# Patient Record
Sex: Male | Born: 1955 | Race: White | Hispanic: No | Marital: Married | Smoking: Former smoker
Health system: Southern US, Community
[De-identification: ages and names within clinical notes are randomized; demographics above are authoritative.]

## PROBLEM LIST (undated history)

## (undated) DIAGNOSIS — E669 Obesity, unspecified: Secondary | ICD-10-CM

## (undated) DIAGNOSIS — I2510 Atherosclerotic heart disease of native coronary artery without angina pectoris: Secondary | ICD-10-CM

## (undated) DIAGNOSIS — E1169 Type 2 diabetes mellitus with other specified complication: Secondary | ICD-10-CM

## (undated) DIAGNOSIS — I255 Ischemic cardiomyopathy: Secondary | ICD-10-CM

## (undated) DIAGNOSIS — I420 Dilated cardiomyopathy: Secondary | ICD-10-CM

## (undated) DIAGNOSIS — I82409 Acute embolism and thrombosis of unspecified deep veins of unspecified lower extremity: Secondary | ICD-10-CM

## (undated) HISTORY — DX: Ischemic cardiomyopathy: I25.5

---

## 1998-06-06 ENCOUNTER — Emergency Department (HOSPITAL_COMMUNITY): Admission: EM | Admit: 1998-06-06 | Discharge: 1998-06-06 | Payer: Self-pay | Admitting: Emergency Medicine

## 2013-11-18 DIAGNOSIS — I82409 Acute embolism and thrombosis of unspecified deep veins of unspecified lower extremity: Secondary | ICD-10-CM

## 2013-11-18 HISTORY — DX: Acute embolism and thrombosis of unspecified deep veins of unspecified lower extremity: I82.409

## 2016-06-14 ENCOUNTER — Emergency Department (HOSPITAL_BASED_OUTPATIENT_CLINIC_OR_DEPARTMENT_OTHER): Payer: 59

## 2016-06-14 ENCOUNTER — Inpatient Hospital Stay (HOSPITAL_BASED_OUTPATIENT_CLINIC_OR_DEPARTMENT_OTHER)
Admission: EM | Admit: 2016-06-14 | Discharge: 2016-06-20 | DRG: 247 | Disposition: A | Discharge status: Home / Self Care | Payer: 59 | Attending: Interventional Cardiology | Admitting: Interventional Cardiology

## 2016-06-14 ENCOUNTER — Encounter (HOSPITAL_BASED_OUTPATIENT_CLINIC_OR_DEPARTMENT_OTHER): Payer: Self-pay | Admitting: Emergency Medicine

## 2016-06-14 ENCOUNTER — Encounter (HOSPITAL_COMMUNITY)
Admission: EM | Disposition: A | Discharge status: Home / Self Care | Payer: Self-pay | Source: Home / Self Care | Attending: Interventional Cardiology

## 2016-06-14 DIAGNOSIS — I82409 Acute embolism and thrombosis of unspecified deep veins of unspecified lower extremity: Secondary | ICD-10-CM

## 2016-06-14 DIAGNOSIS — I25110 Atherosclerotic heart disease of native coronary artery with unstable angina pectoris: Secondary | ICD-10-CM | POA: Diagnosis present

## 2016-06-14 DIAGNOSIS — Z7982 Long term (current) use of aspirin: Secondary | ICD-10-CM

## 2016-06-14 DIAGNOSIS — E1159 Type 2 diabetes mellitus with other circulatory complications: Secondary | ICD-10-CM

## 2016-06-14 DIAGNOSIS — Z6841 Body Mass Index (BMI) 40.0 and over, adult: Secondary | ICD-10-CM

## 2016-06-14 DIAGNOSIS — Z86718 Personal history of other venous thrombosis and embolism: Secondary | ICD-10-CM

## 2016-06-14 DIAGNOSIS — R079 Chest pain, unspecified: Secondary | ICD-10-CM

## 2016-06-14 DIAGNOSIS — K219 Gastro-esophageal reflux disease without esophagitis: Secondary | ICD-10-CM | POA: Diagnosis present

## 2016-06-14 DIAGNOSIS — I2119 ST elevation (STEMI) myocardial infarction involving other coronary artery of inferior wall: Secondary | ICD-10-CM | POA: Diagnosis not present

## 2016-06-14 DIAGNOSIS — E669 Obesity, unspecified: Secondary | ICD-10-CM | POA: Diagnosis not present

## 2016-06-14 DIAGNOSIS — I420 Dilated cardiomyopathy: Secondary | ICD-10-CM | POA: Diagnosis not present

## 2016-06-14 DIAGNOSIS — E876 Hypokalemia: Secondary | ICD-10-CM | POA: Diagnosis not present

## 2016-06-14 DIAGNOSIS — Z86711 Personal history of pulmonary embolism: Secondary | ICD-10-CM

## 2016-06-14 DIAGNOSIS — I2111 ST elevation (STEMI) myocardial infarction involving right coronary artery: Secondary | ICD-10-CM | POA: Diagnosis not present

## 2016-06-14 DIAGNOSIS — I2510 Atherosclerotic heart disease of native coronary artery without angina pectoris: Secondary | ICD-10-CM | POA: Diagnosis not present

## 2016-06-14 DIAGNOSIS — E1165 Type 2 diabetes mellitus with hyperglycemia: Secondary | ICD-10-CM | POA: Diagnosis not present

## 2016-06-14 DIAGNOSIS — Z794 Long term (current) use of insulin: Secondary | ICD-10-CM

## 2016-06-14 DIAGNOSIS — E118 Type 2 diabetes mellitus with unspecified complications: Secondary | ICD-10-CM

## 2016-06-14 DIAGNOSIS — Z23 Encounter for immunization: Secondary | ICD-10-CM

## 2016-06-14 DIAGNOSIS — T82867A Thrombosis of cardiac prosthetic devices, implants and grafts, initial encounter: Secondary | ICD-10-CM | POA: Diagnosis not present

## 2016-06-14 DIAGNOSIS — E785 Hyperlipidemia, unspecified: Secondary | ICD-10-CM | POA: Diagnosis present

## 2016-06-14 DIAGNOSIS — Z87891 Personal history of nicotine dependence: Secondary | ICD-10-CM

## 2016-06-14 DIAGNOSIS — I213 ST elevation (STEMI) myocardial infarction of unspecified site: Secondary | ICD-10-CM | POA: Insufficient documentation

## 2016-06-14 HISTORY — DX: Type 2 diabetes mellitus with other specified complication: E11.69

## 2016-06-14 HISTORY — PX: CARDIAC CATHETERIZATION: SHX172

## 2016-06-14 HISTORY — DX: Acute embolism and thrombosis of unspecified deep veins of unspecified lower extremity: I82.409

## 2016-06-14 HISTORY — DX: Dilated cardiomyopathy: I42.0

## 2016-06-14 HISTORY — DX: Obesity, unspecified: E66.9

## 2016-06-14 HISTORY — DX: Atherosclerotic heart disease of native coronary artery without angina pectoris: I25.10

## 2016-06-14 LAB — CK TOTAL AND CKMB (NOT AT ARMC)
CK TOTAL: 2712 U/L — AB (ref 49–397)
CK, MB: 138.3 ng/mL — ABNORMAL HIGH (ref 0.5–5.0)
CK, MB: 163.1 ng/mL — AB (ref 0.5–5.0)
Relative Index: 6.1 — ABNORMAL HIGH (ref 0.0–2.5)
Relative Index: 6 — ABNORMAL HIGH (ref 0.0–2.5)
Total CK: 2269 U/L — ABNORMAL HIGH (ref 49–397)

## 2016-06-14 LAB — BASIC METABOLIC PANEL
Anion gap: 9 (ref 5–15)
BUN: 16 mg/dL (ref 6–20)
CALCIUM: 8.5 mg/dL — AB (ref 8.9–10.3)
CHLORIDE: 102 mmol/L (ref 101–111)
CO2: 26 mmol/L (ref 22–32)
CREATININE: 1.12 mg/dL (ref 0.61–1.24)
GFR calc non Af Amer: >60 mL/min (ref >60)
Glucose, Bld: 300 mg/dL — ABNORMAL HIGH (ref 65–99)
Potassium: 3.6 mmol/L (ref 3.5–5.1)
SODIUM: 137 mmol/L (ref 135–145)

## 2016-06-14 LAB — POCT ACTIVATED CLOTTING TIME
ACTIVATED CLOTTING TIME: 340 s
ACTIVATED CLOTTING TIME: 224 s
ACTIVATED CLOTTING TIME: 158 s
Activated Clotting Time: 296 seconds

## 2016-06-14 LAB — CBC
HCT: 45.8 % (ref 39.0–52.0)
Hemoglobin: 15.7 g/dL (ref 13.0–17.0)
MCH: 30 pg (ref 26.0–34.0)
MCHC: 34.3 g/dL (ref 30.0–36.0)
MCV: 87.4 fL (ref 78.0–100.0)
PLATELETS: 193 10*3/uL (ref 150–400)
RBC: 5.24 MIL/uL (ref 4.22–5.81)
RDW: 13.5 % (ref 11.5–15.5)
WBC: 10.3 10*3/uL (ref 4.0–10.5)

## 2016-06-14 LAB — PLATELET COUNT: PLATELETS: 175 10*3/uL (ref 150–400)

## 2016-06-14 LAB — TSH: TSH: 1.34 u[IU]/mL (ref 0.350–4.500)

## 2016-06-14 LAB — TROPONIN I
TROPONIN I: 2.32 ng/mL — AB (ref <0.03)
TROPONIN I: 21.98 ng/mL — AB (ref <0.03)

## 2016-06-14 LAB — MRSA PCR SCREENING: MRSA BY PCR: NEGATIVE

## 2016-06-14 SURGERY — LEFT HEART CATH AND CORONARY ANGIOGRAPHY
Anesthesia: LOCAL

## 2016-06-14 MED ORDER — TICAGRELOR 90 MG PO TABS
ORAL_TABLET | ORAL | Status: DC | PRN
  Administered 2016-06-14: 180 mg via ORAL

## 2016-06-14 MED ORDER — ONDANSETRON HCL 4 MG/2ML IJ SOLN: 4.0000 mg | Freq: Four times a day (QID) | INTRAMUSCULAR | Status: DC | PRN

## 2016-06-14 MED ORDER — MORPHINE SULFATE (PF) 2 MG/ML IV SOLN
2.0000 mg | Freq: Once | INTRAVENOUS | Status: AC
  Administered 2016-06-15: 2 mg via INTRAVENOUS
  Filled 2016-06-14: qty 1

## 2016-06-14 MED ORDER — TIROFIBAN HCL IN NACL 5-0.9 MG/100ML-% IV SOLN
INTRAVENOUS | Status: AC
  Filled 2016-06-14: qty 100

## 2016-06-14 MED ORDER — ASPIRIN 81 MG PO CHEW
CHEWABLE_TABLET | ORAL | Status: AC
  Filled 2016-06-14: qty 4

## 2016-06-14 MED ORDER — TIROFIBAN HCL IN NACL 5-0.9 MG/100ML-% IV SOLN
INTRAVENOUS | Status: DC | PRN
  Administered 2016-06-14 (×2): 0.15 ug/kg/min via INTRAVENOUS

## 2016-06-14 MED ORDER — NITROGLYCERIN 0.4 MG SL SUBL
0.4000 mg | SUBLINGUAL_TABLET | SUBLINGUAL | Status: DC | PRN
  Administered 2016-06-14 – 2016-06-17 (×4): 0.4 mg via SUBLINGUAL
  Filled 2016-06-14 (×2): qty 1

## 2016-06-14 MED ORDER — HEPARIN (PORCINE) IN NACL 100-0.45 UNIT/ML-% IJ SOLN
INTRAMUSCULAR | Status: AC
  Filled 2016-06-14: qty 250

## 2016-06-14 MED ORDER — ASPIRIN 81 MG PO CHEW
81.0000 mg | CHEWABLE_TABLET | Freq: Every day | ORAL | Status: DC
  Administered 2016-06-15 – 2016-06-20 (×6): 81 mg via ORAL
  Filled 2016-06-14 (×6): qty 1

## 2016-06-14 MED ORDER — TIROFIBAN (AGGRASTAT) BOLUS VIA INFUSION
INTRAVENOUS | Status: DC | PRN
  Administered 2016-06-14: 1000 ug via INTRAVENOUS
  Administered 2016-06-14: 3175 ug via INTRAVENOUS
  Administered 2016-06-14: 1000 ug via INTRAVENOUS

## 2016-06-14 MED ORDER — SODIUM CHLORIDE 0.9 % IV SOLN
INTRAVENOUS | Status: DC | PRN
  Administered 2016-06-14: 250 mL
  Administered 2016-06-14: 20 mL/h via INTRAVENOUS

## 2016-06-14 MED ORDER — HEPARIN SODIUM (PORCINE) 1000 UNIT/ML IJ SOLN
INTRAMUSCULAR | Status: DC | PRN
  Administered 2016-06-14: 10000 [IU] via INTRAVENOUS

## 2016-06-14 MED ORDER — TIROFIBAN HCL IN NACL 5-0.9 MG/100ML-% IV SOLN
0.1500 ug/kg/min | INTRAVENOUS | Status: AC
  Administered 2016-06-14 – 2016-06-15 (×4): 0.15 ug/kg/min via INTRAVENOUS
  Filled 2016-06-14 (×3): qty 100

## 2016-06-14 MED ORDER — SODIUM CHLORIDE 0.9 % IV SOLN
INTRAVENOUS | Status: DC | PRN
  Administered 2016-06-14: 13:00:00 via INTRACORONARY

## 2016-06-14 MED ORDER — LIDOCAINE HCL (PF) 1 % IJ SOLN
INTRAMUSCULAR | Status: DC | PRN
  Administered 2016-06-14: 20 mL
  Administered 2016-06-14: 2 mL

## 2016-06-14 MED ORDER — SODIUM CHLORIDE 0.9% FLUSH: 3.0000 mL | INTRAVENOUS | Status: DC | PRN

## 2016-06-14 MED ORDER — HEPARIN (PORCINE) IN NACL 2-0.9 UNIT/ML-% IJ SOLN
INTRAMUSCULAR | Status: AC
  Filled 2016-06-14: qty 1000

## 2016-06-14 MED ORDER — SODIUM CHLORIDE 0.9 % IV SOLN
INTRAVENOUS | Status: AC
  Administered 2016-06-14: 15:00:00 via INTRAVENOUS

## 2016-06-14 MED ORDER — HEPARIN (PORCINE) IN NACL 2-0.9 UNIT/ML-% IJ SOLN
INTRAMUSCULAR | Status: DC | PRN
  Administered 2016-06-14: 1000 mL

## 2016-06-14 MED ORDER — SODIUM CHLORIDE 0.9% FLUSH
3.0000 mL | Freq: Two times a day (BID) | INTRAVENOUS | Status: DC
  Administered 2016-06-14 – 2016-06-19 (×9): 3 mL via INTRAVENOUS

## 2016-06-14 MED ORDER — LIDOCAINE HCL (PF) 1 % IJ SOLN
INTRAMUSCULAR | Status: AC
Start: 2016-06-14 — End: 2016-06-14
  Filled 2016-06-14: qty 30

## 2016-06-14 MED ORDER — NITROGLYCERIN 1 MG/10 ML FOR IR/CATH LAB
INTRA_ARTERIAL | Status: AC
  Filled 2016-06-14: qty 10

## 2016-06-14 MED ORDER — ZOLPIDEM TARTRATE 5 MG PO TABS
5.0000 mg | ORAL_TABLET | Freq: Every evening | ORAL | Status: DC | PRN
  Administered 2016-06-15 – 2016-06-16 (×3): 5 mg via ORAL
  Filled 2016-06-14 (×3): qty 1

## 2016-06-14 MED ORDER — IOPAMIDOL (ISOVUE-370) INJECTION 76%
INTRAVENOUS | Status: DC | PRN
Start: 2016-06-14 — End: 2016-06-14
  Administered 2016-06-14: 125 mL via INTRA_ARTERIAL

## 2016-06-14 MED ORDER — ATORVASTATIN CALCIUM 80 MG PO TABS: 80.0000 mg | ORAL_TABLET | Freq: Every day | ORAL | Status: DC

## 2016-06-14 MED ORDER — ASPIRIN 81 MG PO CHEW
324.0000 mg | CHEWABLE_TABLET | Freq: Once | ORAL | Status: AC
  Administered 2016-06-14: 324 mg via ORAL

## 2016-06-14 MED ORDER — ENOXAPARIN SODIUM 40 MG/0.4ML ~~LOC~~ SOLN
40.0000 mg | SUBCUTANEOUS | Status: DC
  Administered 2016-06-15 – 2016-06-17 (×3): 40 mg via SUBCUTANEOUS
  Filled 2016-06-14 (×3): qty 0.4

## 2016-06-14 MED ORDER — FENTANYL CITRATE (PF) 100 MCG/2ML IJ SOLN
INTRAMUSCULAR | Status: AC
  Filled 2016-06-14: qty 2

## 2016-06-14 MED ORDER — HEPARIN SODIUM (PORCINE) 1000 UNIT/ML IJ SOLN
INTRAMUSCULAR | Status: AC
  Filled 2016-06-14: qty 1

## 2016-06-14 MED ORDER — IOPAMIDOL (ISOVUE-370) INJECTION 76%
INTRAVENOUS | Status: AC
  Filled 2016-06-14: qty 125

## 2016-06-14 MED ORDER — NITROGLYCERIN 2 % TD OINT
1.0000 [in_us] | TOPICAL_OINTMENT | Freq: Once | TRANSDERMAL | Status: AC
  Administered 2016-06-14: 1 [in_us] via TOPICAL

## 2016-06-14 MED ORDER — VERAPAMIL HCL 2.5 MG/ML IV SOLN
INTRAVENOUS | Status: DC | PRN
  Administered 2016-06-14: 10 mL via INTRA_ARTERIAL

## 2016-06-14 MED ORDER — MIDAZOLAM HCL 2 MG/2ML IJ SOLN
INTRAMUSCULAR | Status: DC | PRN
  Administered 2016-06-14: 1 mg via INTRAVENOUS

## 2016-06-14 MED ORDER — ALPRAZOLAM 0.25 MG PO TABS
0.2500 mg | ORAL_TABLET | Freq: Two times a day (BID) | ORAL | Status: DC | PRN
  Administered 2016-06-15: 0.25 mg via ORAL
  Filled 2016-06-14: qty 1

## 2016-06-14 MED ORDER — HEPARIN SODIUM (PORCINE) 5000 UNIT/ML IJ SOLN
4000.0000 [IU] | Freq: Once | INTRAMUSCULAR | Status: AC
  Administered 2016-06-14: 4000 [IU] via INTRAVENOUS
  Filled 2016-06-14: qty 1

## 2016-06-14 MED ORDER — ADENOSINE 12 MG/4ML IV SOLN
INTRAVENOUS | Status: AC
  Filled 2016-06-14: qty 4

## 2016-06-14 MED ORDER — TICAGRELOR 90 MG PO TABS
90.0000 mg | ORAL_TABLET | Freq: Two times a day (BID) | ORAL | Status: DC
  Administered 2016-06-14 – 2016-06-18 (×8): 90 mg via ORAL
  Filled 2016-06-14 (×8): qty 1

## 2016-06-14 MED ORDER — SODIUM CHLORIDE 0.9 % IV BOLUS (SEPSIS)
1000.0000 mL | Freq: Once | INTRAVENOUS | Status: AC
  Administered 2016-06-14: 1000 mL via INTRAVENOUS

## 2016-06-14 MED ORDER — ADENOSINE 6 MG/2ML IV SOLN
INTRAVENOUS | Status: DC | PRN
  Administered 2016-06-14 (×2): 30 ug via INTRAVENOUS
  Administered 2016-06-14: 60 ug via INTRAVENOUS

## 2016-06-14 MED ORDER — NITROGLYCERIN 2 % TD OINT
TOPICAL_OINTMENT | TRANSDERMAL | Status: AC
  Filled 2016-06-14: qty 1

## 2016-06-14 MED ORDER — MIDAZOLAM HCL 2 MG/2ML IJ SOLN
INTRAMUSCULAR | Status: AC
  Filled 2016-06-14: qty 2

## 2016-06-14 MED ORDER — METOPROLOL TARTRATE 12.5 MG HALF TABLET
12.5000 mg | ORAL_TABLET | Freq: Two times a day (BID) | ORAL | Status: DC
  Administered 2016-06-14 – 2016-06-17 (×7): 12.5 mg via ORAL
  Filled 2016-06-14 (×7): qty 1

## 2016-06-14 MED ORDER — ACETAMINOPHEN 325 MG PO TABS: 650.0000 mg | ORAL_TABLET | ORAL | Status: DC | PRN

## 2016-06-14 MED ORDER — ATORVASTATIN CALCIUM 80 MG PO TABS
80.0000 mg | ORAL_TABLET | Freq: Every day | ORAL | Status: DC
  Administered 2016-06-14 – 2016-06-19 (×6): 80 mg via ORAL
  Filled 2016-06-14 (×6): qty 1

## 2016-06-14 MED ORDER — ACETAMINOPHEN 325 MG PO TABS
650.0000 mg | ORAL_TABLET | ORAL | Status: DC | PRN
Start: 2016-06-14 — End: 2016-06-20

## 2016-06-14 MED ORDER — ALUM & MAG HYDROXIDE-SIMETH 200-200-20 MG/5ML PO SUSP
30.0000 mL | Freq: Once | ORAL | Status: AC
  Administered 2016-06-14: 30 mL via ORAL
  Filled 2016-06-14: qty 30

## 2016-06-14 MED ORDER — TIROFIBAN HCL IN NACL 5-0.9 MG/100ML-% IV SOLN
INTRAVENOUS | Status: AC
Start: 2016-06-14 — End: 2016-06-14
  Filled 2016-06-14: qty 100

## 2016-06-14 MED ORDER — SODIUM CHLORIDE 0.9 % IV SOLN
250.0000 mL | INTRAVENOUS | Status: DC | PRN
  Administered 2016-06-18: 250 mL/h via INTRAVENOUS

## 2016-06-14 MED ORDER — FENTANYL CITRATE (PF) 100 MCG/2ML IJ SOLN
INTRAMUSCULAR | Status: DC | PRN
  Administered 2016-06-14: 25 ug via INTRAVENOUS

## 2016-06-14 SURGICAL SUPPLY — 23 items
BALLN EMERGE MR 2.5X15 (BALLOONS) ×2
BALLN EMERGE MR 3.5X20 (BALLOONS) ×2
BALLOON EMERGE MR 2.5X15 (BALLOONS) IMPLANT
BALLOON EMERGE MR 3.5X20 (BALLOONS) IMPLANT
CATH ANGIOJET SPIRO ULTR 135CM (CATHETERS) ×1 IMPLANT
CATH EXTRAC PRONTO LP 6F RND (CATHETERS) ×1 IMPLANT
CATH INFINITI 5 FR JL3.5 (CATHETERS) ×1 IMPLANT
CATH INFINITI 5FR ANG PIGTAIL (CATHETERS) ×1 IMPLANT
CATH S G BIP PACING (SET/KITS/TRAYS/PACK) ×1 IMPLANT
CATH VISTA GUIDE 6FR JR4 (CATHETERS) ×1 IMPLANT
DEVICE RAD COMP TR BAND LRG (VASCULAR PRODUCTS) ×1 IMPLANT
GLIDESHEATH SLEND SS 6F .021 (SHEATH) ×1 IMPLANT
KIT ENCORE 26 ADVANTAGE (KITS) ×1 IMPLANT
KIT HEART LEFT (KITS) ×2 IMPLANT
PACK CARDIAC CATHETERIZATION (CUSTOM PROCEDURE TRAY) ×2 IMPLANT
SHEATH PINNACLE 6F 10CM (SHEATH) ×1 IMPLANT
STENT SYNERGY DES 4X38 (Permanent Stent) ×1 IMPLANT
SYR MEDRAD MARK V 150ML (SYRINGE) ×2 IMPLANT
TRANSDUCER W/STOPCOCK (MISCELLANEOUS) ×2 IMPLANT
TUBING CIL FLEX 10 FLL-RA (TUBING) ×2 IMPLANT
VALVE GUARDIAN II ~~LOC~~ HEMO (MISCELLANEOUS) ×1 IMPLANT
WIRE ASAHI PROWATER 180CM (WIRE) ×1 IMPLANT
WIRE SAFE-T 1.5MM-J .035X260CM (WIRE) ×1 IMPLANT

## 2016-06-14 NOTE — ED Notes (Signed)
Pt on automatic VS and cardiac monitor. RN, RT and MD in room.

## 2016-06-14 NOTE — ED Notes (Signed)
[facility] is on the way for transporting patient to [facility] and EMS has been cancelled out --spoke with Jill Alexanders at EMS

## 2016-06-14 NOTE — ED Notes (Signed)
Report given to Justin EMTP with [facility]. 

## 2016-06-14 NOTE — ED Provider Notes (Signed)
The patient is a 60 year old male who states that approximately one hour prior to arrival he developed symptoms central chest discomfort which has been persistent, 5 out of 10, associated with diaphoresis. He reports that he has a history of deep venous thrombosis approximately one year ago for which she was anticoagulated for a short while, he no longer takes anticoagulation nor does he take any medication for high blood pressure diabetes or high cholesterol. He used to smoke tobacco many years ago but no longer does. He does report that he's been having daily chest discomfort but mostly at night and he thought that this was mostly acid reflux. On exam the patient is diaphoretic and ill-appearing. He is bradycardic with a pulse of between 50 and 55, his blood pressure is 109 systolic. He has unlabored breathing with normal lung sounds, his heart sounds are normal without murmurs rubs or gallops, he has no peripheral edema or JVD. His abdomen is nontender, his blood pressure is in a normal range though it is slightly low with his bradycardia. His EKG shows an ST elevation MI with elevation in leads 23 and aVF with slight Q waves in lead 3, there is also ST depression in leads 1 and aVL as well as leads V1 and V2, V5 and V6. This is concerning for acute myocardial infarction and possibly right ventricular involvement. Intravenous nitroglycerin was avoided, subcutaneous lingula was avoided, a small amount of nitroglycerin paste was added. Heparin was given, IV fluid bolus was given for relative hypotension with likely right ventricular involvement and aspirin was ordered. The patient is critically ill. I discussed his care with the cardiology team, they're currently in the heart catheterization lab but accepted transfer of the patient to that area for an immediate heart catheterization. I discussed the care with the critical care transport service who is sending a truck immediately. The patient will remain on the  cardiac monitor, frequent checks, I have reexamined him 3 times during his short 35 minute stay, he remained unchanged with regards to his stability. His mental status was normal, he remained mildly diaphoretic.  Medical screening examination/treatment/procedure(s) were conducted as a shared visit with non-physician practitioner(s) and myself.  I personally evaluated the patient during the encounter.  Clinical Impression:   Final diagnoses:  ST elevation myocardial infarction involving right coronary artery (HCC)    CRITICAL CARE Performed by: Vida Roller Total critical care time: 35 minutes Critical care time was exclusive of separately billable procedures and treating other patients. Critical care was necessary to treat or prevent imminent or life-threatening deterioration. Critical care was time spent personally by me on the following activities: development of treatment plan with patient and/or surrogate as well as nursing, discussions with consultants, evaluation of patient's response to treatment, examination of patient, obtaining history from patient or surrogate, ordering and performing treatments and interventions, ordering and review of laboratory studies, ordering and review of radiographic studies, pulse oximetry and re-evaluation of patient's condition.  Medications  sodium chloride 0.9 % bolus 1,000 mL (1,000 mLs Intravenous New Bag/Given 06/14/16 1119)  aspirin chewable tablet 324 mg (324 mg Oral Given 06/14/16 1117)  heparin injection 4,000 Units (4,000 Units Intravenous Given 06/14/16 1125)  nitroGLYCERIN (NITROGLYN) 2 % ointment 1 inch (1 inch Topical Given 06/14/16 1128)      Eber Hong, MD 06/14/16 1547

## 2016-06-14 NOTE — ED Triage Notes (Signed)
Pt c/o non radiating mid chest pain onset 1 hour ago while working at his desk. Pt states he is nauseated, no vomiting. Pt is extremely diaphoretic.

## 2016-06-14 NOTE — ED Notes (Signed)
Attempted to give report to cath lab

## 2016-06-14 NOTE — H&P (Signed)
History and Physical   Patient ID: Tyler Harper MRN: [id number], DOB/AGE: 22-Jun-1956 60 y.o. Date of Encounter: 06/14/2016  Primary Physician: Kendrick Fries, PA Primary Cardiologist: New, Dr Eldridge Dace  Chief Complaint:  STEMI  HPI: Tyler Harper is a 59 y.o. male with a history of obesity, remote tobacco use, DVT.   Today, he was in his usual state of health. He had sudden onset of SSCP at about 10:00 am. He was at work, no strenuous activity. It was associated with SOB, nausea and diaphoresis. The pain was initially a 5/10. He had never had it before.   He went to [facility], and his ECG was consistent with an inferior STEMI. He was given ASA 324 mg, Heparin 4000 U, nitro paste. Pads were placed. His BP was low and he was given 1000 cc NS. [facility] was called.  Pt was still having chest pain, but BP was too low for additional NTG. His ECG was still abnormal. He was given morphine 4 mg en route. He was transported emergently to [facility] and taken directly to the cath lab. On arrival to the cath lab, his chest pain was a 3/10. His nausea had resolved and diaphoresis had improved.   Past Medical History:  Diagnosis Date  . Deep vein thrombosis (DVT) (HCC) 2015    Surgical History: History reviewed. No pertinent surgical history.   I have reviewed the patient's current medications. Prior to Admission medications   ASA 81 mg most days   Allergies: No Known Allergies  Social History   Social History  . Marital status: Married    Spouse name: N/A  . Number of children: N/A  . Years of education: N/A   Occupational History  . COO/CFO local company    Social History Main Topics  . Smoking status: Former Smoker    Types: Cigarettes  . Smokeless tobacco: Never Used  . Alcohol use 1.2 oz/week    1 Glasses of wine, 1 Shots of liquor per week  . Drug use: No  . Sexual activity: Not on file   Other Topics Concern  . Not on file   Social History Narrative   He lives in  [place] with his wife.    Family History  Problem Relation Age of Onset  . CAD Mother     She died in her 53s   Family Status  Relation Status  . Mother Deceased    Review of Systems:   Full 14-point review of systems otherwise negative except as noted above.  Physical Exam: Blood pressure 113/79, pulse 60, resp. rate 16, SpO2 100 %. General: Well developed, well nourished,male in acute distress. Head: Normocephalic, atraumatic, sclera non-icteric, no xanthomas, nares are without discharge. Dentition: good Neck: No carotid bruits. JVD not elevated. No thyromegally Lungs: Good expansion bilaterally. without wheezes or rhonchi.  Heart: Regular rate and rhythm with S1 S2.  No S3 or S4.  No murmur, no rubs, or gallops appreciated. Abdomen: Soft, non-tender, non-distended with normoactive bowel sounds. No hepatomegaly. No rebound/guarding. No obvious abdominal masses. Msk:  Strength and tone appear normal for age. No joint deformities or effusions. Extremities: No clubbing or cyanosis. No edema.  Distal pedal pulses are 2+ in 4 extrem Neuro: Alert and oriented X 3. Moves all extremities spontaneously. No focal deficits noted. Psych:  Responds to questions appropriately with a normal affect. Skin: No rashes or lesions noted  Labs:   Lab Results  Component Value Date   WBC  10.3 06/14/2016   HGB 15.7 06/14/2016   HCT 45.8 06/14/2016   MCV 87.4 06/14/2016   PLT 193 06/14/2016     Recent Labs Lab 06/14/16 1115  NA 137  K 3.6  CL 102  CO2 26  BUN 16  CREATININE 1.12  CALCIUM 8.5*  GLUCOSE 300*    Recent Labs  06/14/16 1115  TROPONINI 2.32*   Radiology/Studies: Dg Chest Portable 1 View Result Date: 06/14/2016 CLINICAL DATA:  Chest pain beginning this morning.  Sweating. EXAM: PORTABLE CHEST 1 VIEW COMPARISON:  None. FINDINGS: Lungs are clear. Lung volumes are somewhat low. Heart size is normal. No pneumothorax or pleural effusion. IMPRESSION: No acute disease.  Electronically Signed   By: Drusilla Kanner M.D.   On: 06/14/2016 11:43  ECG: 07/27 Sinus brady, HR 48 Inferior ST elevation  ASSESSMENT AND PLAN:  Principal Problem:   Acute MI, inferior wall, initial episode of care [facility]) - pt being taken directly to the cath lab, with further evaluation and treatment depending on the results. He will be screened for CRFs (including an A1c) and their control.  Tawny Asal 06/14/2016 12:35 PM Beeper [phone]   I have examined the patient and reviewed assessment and plan and discussed with patient.  Agree with above as stated.  I personally reviewed the ECG and made the decision to bring him to the cath lab.  He had a stent to the RCA.  Due  To large thrombus burden, will continue blood thinner for longer period of time.  He will need weight loss and aggressive secondary prevention.  Prior DVT/PE after long plane trip.  WIll need DAPT for a year and probably plavix after that.   Lance Muss

## 2016-06-14 NOTE — ED Notes (Signed)
Pt placed on O2 at 2liters/min.

## 2016-06-14 NOTE — ED Notes (Signed)
Crash cart to bedside, pads attached.

## 2016-06-14 NOTE — ED Notes (Signed)
[facility] transport here to transfer pt to [facility] cath lab. Care turned over to [facility] team.

## 2016-06-14 NOTE — ED Provider Notes (Signed)
[facility] Provider Note   CSN: [id number] Arrival date & time: 06/14/16  1105  First Provider Contact:  First MD Initiated Contact with Patient 06/14/16 1118        History   Chief Complaint Chief Complaint  Patient presents with  . Chest Pain    HPI Tyler Harper is a 60 y.o. male.  HPI   Tyler Harper is a 60 y.o. male, with a history of DVT and obesity, presenting to the ED with chest pain and diaphoresis that began suddenly approximately an hour prior to arrival. Patient states he was at rest sitting at his desk at work when the symptoms arose. Rates the pain at 5 out of 10, describes it as an intense retrosternal pain, nonradiating. Patient has not experiencing symptoms before. Patient states that he has not had exertional chest pain or shortness of breath leading up to today's event, but does endorse some intermittent chest pain over the past few days, mostly at night, which he attributed to GERD. Patient denies previous MI, hypertension, or other risk factors. Patient endorses some nausea. Patient denies vomiting, shortness of breath, dizziness, or any other complaints.     Past Medical History:  Diagnosis Date  . Deep vein thrombosis (DVT) (HCC)     There are no active problems to display for this patient.   History reviewed. No pertinent surgical history.     Home Medications    Prior to Admission medications   Not on File    Family History No family history on file.  Social History Social History  Substance Use Topics  . Smoking status: Former Games developer  . Smokeless tobacco: Never Used  . Alcohol use No     Allergies   Review of patient's allergies indicates no known allergies.   Review of Systems Review of Systems  Constitutional: Positive for diaphoresis. Negative for chills and fever.  Respiratory: Negative for cough and shortness of breath.   Cardiovascular: Positive for chest pain. Negative for palpitations and leg swelling.   Gastrointestinal: Negative for abdominal pain, constipation, diarrhea, nausea and vomiting.  Musculoskeletal: Negative for back pain.  Skin: Positive for pallor. Negative for color change.  Neurological: Negative for dizziness, syncope, weakness and light-headedness.  All other systems reviewed and are negative.    Physical Exam Updated Vital Signs BP 105/72   Pulse 60   Resp 16   SpO2 99%   Physical Exam  Constitutional: He appears well-developed and well-nourished. He appears distressed.  HENT:  Head: Normocephalic and atraumatic.  Eyes: Conjunctivae are normal.  Neck: Neck supple.  Cardiovascular: Regular rhythm, normal heart sounds and intact distal pulses.  Bradycardia present.   Patient is mildly bradycardic in the 50s.  Pulmonary/Chest: Effort normal and breath sounds normal. No respiratory distress.  Abdominal: Soft. There is no tenderness. There is no guarding.  Musculoskeletal: He exhibits no edema or tenderness.  Lymphadenopathy:    He has no cervical adenopathy.  Neurological: He is alert.  Skin: He is diaphoretic. There is pallor.  Cool and clammy.  Psychiatric: He has a normal mood and affect. His behavior is normal.  Nursing note and vitals reviewed.    ED Treatments / Results  Labs (all labs ordered are listed, but only abnormal results are displayed) Labs Reviewed  CBC  BASIC METABOLIC PANEL  TROPONIN I    EKG  EKG Interpretation  Date/Time:  Friday June 14 2016 11:11:46 EDT Ventricular Rate:  48 PR Interval:    QRS Duration:  106 QT Interval:  401 QTC Calculation: 359 R Axis:   21 Text Interpretation:  Sinus bradycardia Inferoposterior infarct, acute (RCA) Consider anterolateral infarct Probable RV involvement, suggest recording right precordial leads Baseline wander in lead(s) V4 ** ** ACUTE MI / STEMI ** ** No old tracing to compare Confirmed by MILLER  MD, BRIAN ([id number]) on 06/14/2016 11:34:59 AM       Radiology No results  found.  Procedures Procedures (including critical care time)  CRITICAL CARE Performed by: Shawn C Joy Total critical care time: 30 minutes Critical care time was exclusive of separately billable procedures and treating other patients. Critical care was necessary to treat or prevent imminent or life-threatening deterioration. Critical care was time spent personally by me on the following activities: development of treatment plan with patient and/or surrogate as well as nursing, discussions with consultants, evaluation of patient's response to treatment, examination of patient, obtaining history from patient or surrogate, ordering and performing treatments and interventions, ordering and review of laboratory studies, ordering and review of radiographic studies, pulse oximetry and re-evaluation of patient's condition.  Medications Ordered in ED Medications  sodium chloride 0.9 % bolus 1,000 mL (1,000 mLs Intravenous New Bag/Given 06/14/16 1119)  aspirin chewable tablet 324 mg (324 mg Oral Given 06/14/16 1117)  heparin injection 4,000 Units (4,000 Units Intravenous Given 06/14/16 1125)  nitroGLYCERIN (NITROGLYN) 2 % ointment 1 inch (1 inch Topical Given 06/14/16 1128)     Initial Impression / Assessment and Plan / ED Course  I have reviewed the triage vital signs and the nursing notes.  Pertinent labs & imaging results that were available during my care of the patient were reviewed by me and considered in my medical decision making (see chart for details).  Clinical Course    Tyler Harper presents with chest pain, diaphoresis, pallor, and nausea that arose about an hour prior to arrival.  Patient is ill-appearing, diaphoretic, and pale with inferior STEMI with likely lateral and posterior involvement noted on EKG. ST elevation in leads II, III, and aVF as well as in V5 and V6 with reciprocal ST depression in V1, V2, and aVL. Dr. Hyacinth Meeker spoke with the STEMI cardiologist, Dr. Eldridge Dace, who  accepted emergent transfer of this patient. Patient transferred to [facility] ED with direct admittance to the Cath Lab. Patient was reassessed multiple times within the short time he was here in this ED. Treatments prior to transfer included aspirin, IV heparin bolus, and a small dose of nitroglycerin paste.  Vitals:   06/14/16 1110 06/14/16 1130  BP: 109/77 105/72  Pulse: (!) 53 60  Resp:  16  SpO2: 100% 99%     Final Clinical Impressions(s) / ED Diagnoses   Final diagnoses:  ST elevation myocardial infarction involving right coronary artery [facility])    New Prescriptions New Prescriptions   No medications on file     Anselm Pancoast, PA-C 06/14/16 1139    Anselm Pancoast, PA-C 06/14/16 1147    Eber Hong, MD 06/14/16 1547

## 2016-06-15 DIAGNOSIS — I2119 ST elevation (STEMI) myocardial infarction involving other coronary artery of inferior wall: Secondary | ICD-10-CM | POA: Diagnosis not present

## 2016-06-15 DIAGNOSIS — I420 Dilated cardiomyopathy: Secondary | ICD-10-CM | POA: Diagnosis not present

## 2016-06-15 DIAGNOSIS — E1165 Type 2 diabetes mellitus with hyperglycemia: Secondary | ICD-10-CM | POA: Diagnosis not present

## 2016-06-15 DIAGNOSIS — Z6841 Body Mass Index (BMI) 40.0 and over, adult: Secondary | ICD-10-CM | POA: Diagnosis not present

## 2016-06-15 LAB — COMPREHENSIVE METABOLIC PANEL
ALK PHOS: 46 U/L (ref 38–126)
ALT: 60 U/L (ref 17–63)
ANION GAP: 8 (ref 5–15)
AST: 163 U/L — ABNORMAL HIGH (ref 15–41)
Albumin: 3.2 g/dL — ABNORMAL LOW (ref 3.5–5.0)
BUN: 17 mg/dL (ref 6–20)
CALCIUM: 8 mg/dL — AB (ref 8.9–10.3)
CO2: 22 mmol/L (ref 22–32)
CREATININE: 1.04 mg/dL (ref 0.61–1.24)
Chloride: 102 mmol/L (ref 101–111)
Glucose, Bld: 314 mg/dL — ABNORMAL HIGH (ref 65–99)
Potassium: 4.1 mmol/L (ref 3.5–5.1)
Sodium: 132 mmol/L — ABNORMAL LOW (ref 135–145)
TOTAL PROTEIN: 5.6 g/dL — AB (ref 6.5–8.1)
Total Bilirubin: 0.8 mg/dL (ref 0.3–1.2)

## 2016-06-15 LAB — CBC
HCT: 39.8 % (ref 39.0–52.0)
HEMOGLOBIN: 13.1 g/dL (ref 13.0–17.0)
MCH: 29.8 pg (ref 26.0–34.0)
MCHC: 32.9 g/dL (ref 30.0–36.0)
MCV: 90.5 fL (ref 78.0–100.0)
PLATELETS: 169 10*3/uL (ref 150–400)
RBC: 4.4 MIL/uL (ref 4.22–5.81)
RDW: 13.4 % (ref 11.5–15.5)
WBC: 11.9 10*3/uL — AB (ref 4.0–10.5)

## 2016-06-15 LAB — CK TOTAL AND CKMB (NOT AT ARMC)
CK TOTAL: 1873 U/L — AB (ref 49–397)
CK, MB: 115.1 ng/mL — ABNORMAL HIGH (ref 0.5–5.0)
RELATIVE INDEX: 6.1 — AB (ref 0.0–2.5)

## 2016-06-15 LAB — HEMOGLOBIN A1C
HEMOGLOBIN A1C: 10.6 % — AB (ref 4.8–5.6)
MEAN PLASMA GLUCOSE: 258 mg/dL

## 2016-06-15 LAB — TROPONIN I
TROPONIN I: 17.33 ng/mL — AB (ref <0.03)
Troponin I: 34.87 ng/mL (ref <0.03)

## 2016-06-15 MED ORDER — MORPHINE SULFATE (PF) 2 MG/ML IV SOLN
INTRAVENOUS | Status: AC
  Filled 2016-06-15: qty 1

## 2016-06-15 MED ORDER — MORPHINE SULFATE (PF) 2 MG/ML IV SOLN
2.0000 mg | INTRAVENOUS | Status: DC | PRN
  Administered 2016-06-15 – 2016-06-17 (×2): 2 mg via INTRAVENOUS
  Filled 2016-06-15: qty 1

## 2016-06-15 NOTE — Progress Notes (Addendum)
CARDIOLOGY ROUNDING NOTE  Subjective:    Had some chest tightness overnight. Now resolved. No CP/SOB. Troponin coming down .    Intake/Output Summary (Last 24 hours) at 06/15/16 1035 Last data filed at 06/15/16 0904  Gross per 24 hour  Intake          1593.51 ml  Output                0 ml  Net          1593.51 ml    Current meds: . aspirin  81 mg Oral Daily  . atorvastatin  80 mg Oral q1800  . enoxaparin (LOVENOX) injection  40 mg Subcutaneous Q24H  . metoprolol tartrate  12.5 mg Oral BID  . sodium chloride flush  3 mL Intravenous Q12H  . ticagrelor  90 mg Oral BID   Infusions:     Objective:  Blood pressure 113/87, pulse 77, temperature 97.9 F (36.6 C), temperature source Oral, resp. rate 19, height  (1.778 m), weight 127.6 kg (281 lb 4.9 oz), SpO2 98 %. Weight change:   Physical Exam: General:  Well appearing. No resp difficulty HEENT: normal Neck: supple. JVP not elevated . Carotids 2+ bilat; no bruits. No lymphadenopathy or thryomegaly appreciated. Cor: PMI nondisplaced. Regular rate & rhythm. No rubs, gallops or murmurs. Lungs: clear Abdomen: obese, soft, nontender, nondistended. No hepatosplenomegaly. No bruits or masses. Good bowel sounds. Extremities: no cyanosis, clubbing, rash, edema R groin hematoma (from temp pacer) Neuro: alert & orientedx3, cranial nerves grossly intact. moves all 4 extremities w/o difficulty. Affect pleasant  Telemetry: NSR  Lab Results: Basic Metabolic Panel:  Recent Labs Lab 06/14/16 1115 06/15/16 0225  NA 137 132*  K 3.6 4.1  CL 102 102  CO2 26 22  GLUCOSE 300* 314*  BUN 16 17  CREATININE 1.12 1.04  CALCIUM 8.5* 8.0*   Liver Function Tests:  Recent Labs Lab 06/15/16 0225  AST 163*  ALT 60  ALKPHOS 46  BILITOT 0.8  PROT 5.6*  ALBUMIN 3.2*   No results for input(s): LIPASE, AMYLASE in the last 168 hours. No results for input(s): AMMONIA in the last 168 hours. CBC:  Recent Labs Lab 06/14/16 1115  06/14/16 2042 06/15/16 0225  WBC 10.3  --  11.9*  HGB 15.7  --  13.1  HCT 45.8  --  39.8  MCV 87.4  --  90.5  PLT 193 175 169   Cardiac Enzymes:  Recent Labs Lab 06/14/16 1115 06/14/16 1511 06/14/16 2042 06/15/16 0225  CKTOTAL  --  2,269* 2,712* 1,873*  CKMB  --  138.3* 163.1* 115.1*  TROPONINI 2.32* 21.98* >65.00* 34.87*   BNP: Invalid input(s): POCBNP CBG: No results for input(s): GLUCAP in the last 168 hours. Microbiology: No results found for: CULT No results for input(s): CULT, SDES in the last 168 hours.  Imaging: Dg Chest Portable 1 View  Result Date: 06/14/2016 CLINICAL DATA:  Chest pain beginning this morning.  Sweating. EXAM: PORTABLE CHEST 1 VIEW COMPARISON:  None. FINDINGS: Lungs are clear. Lung volumes are somewhat low. Heart size is normal. No pneumothorax or pleural effusion. IMPRESSION: No acute disease. Electronically Signed   By: Drusilla Kanner M.D.   On: 06/14/2016 11:43    ASSESSMENT:  1. Inferior STEMI, peak trop > 65  --large clot burden so treated with tirofiban post cath x 18 hours  --s/p DES to RCA 7/28  --echo pending  --cardiac rehab  --ECG with persistent ST elevation inferiorly.  2. CAD   --cath with RCA 100%, LAD 10%, LCX ok   --continue ASA/Brilinta, statin, b-blocker 3. Morbid obesity 4. DVT    --about 2 years ago after long plane ride. Treated with Eliquis x 6 months. Now off.   PLAN/DISCUSSION:  Doing well s/p PCI of RCA. Given heavy clot burden and pain overnight will keep in SDU overnight. Check echo. Continue ASA/Brilinta, statin, b-blocker. Cardia rehab to see.   D/W family.    LOS: 1 day   Arvilla Meres, MD 06/15/2016, 10:35 AM

## 2016-06-15 NOTE — Progress Notes (Signed)
Dr. Sullivan Lone notified pf pts response, pain still there but better 1/10.

## 2016-06-15 NOTE — Progress Notes (Signed)
Notified Dr. Sullivan Lone of EKG completion, updated on no new concern at this time, pt resting comfortably, no s/s of distress noted.

## 2016-06-15 NOTE — Progress Notes (Signed)
Pt note with no change after interventions, Dr. Sullivan Lone called back to check on pt, order received

## 2016-06-15 NOTE — Progress Notes (Signed)
Pt resting comfortably, no s/s of distress noted, breathing even and unlabored. States morphine given helped a little, discomfort is better now but he still knows its there. Dr. Sullivan Lone made aware of resistant pain, orders received

## 2016-06-15 NOTE — Progress Notes (Signed)
Dr. Sullivan Lone at pts bedside to see pt

## 2016-06-15 NOTE — Plan of Care (Signed)
Problem: Phase I Progression Outcomes Goal: Voiding-avoid urinary catheter unless indicated Outcome: Progressing Pt was told not to hold urine, if needs to void please tell rn, pt previously holding urine during the night.  Goal: Vascular site scale level 0 - I Vascular Site Scale Level 0: No bruising/bleeding/hematoma Level I (Mild): Bruising/Ecchymosis, minimal bleeding/ooozing, palpable hematoma < 3 cm Level II (Moderate): Bleeding not affecting hemodynamic parameters, pseudoaneurysm, palpable hematoma > 3 cm Level III  (Severe) Bleeding which affects hemodynamic parameters or retroperitoneal hemorrhage   Outcome: Progressing Pt right radial site level 0, however is right groin site is a level 1, and pt did have hematoma. Will be monitoring.  Problem: Tissue Perfusion: Goal: Risk factors for ineffective tissue perfusion will decrease Outcome: Progressing ekg still showing some elevation in inferior leads.

## 2016-06-15 NOTE — Progress Notes (Signed)
CARDIAC REHAB PHASE I   PRE:  Rate/Rhythm: 75 sinus rhythm  BP:  Supine:    Sitting: 119/84   Standing:    SaO2: 97% ra   MODE:  Ambulation: 600 ft   POST:  Rate/Rhythem: 73 sinus rhythm  BP:  Supine:   Sitting: 137/92  Standing:    SaO2: 99% ra   Pt ambulated in hallway x 1 assist, steady gait, tolerated well asymptomatic.  Pt given MI booklet and oriented on outpatient  Cardiac rehab. Referral will be sent to [facility] cardiac rehab.    [employer]

## 2016-06-15 NOTE — Progress Notes (Signed)
Pt c/o burning sensation that feels like heartburn in upper chest area, states he usually has the discomfort if he eats after 8pm. MD paged

## 2016-06-15 NOTE — Plan of Care (Signed)
Cardiology Plan of Care  Called regarding episode of chest burning initially described as different in quality & severity than his original pain.  It did not respond to Maalox & improved slightly with SL NTG x 3.  It resolved with Morphine 2 mg IV x 1.  EKG demonstrated borderline worsening of his inferior leads compared to immediately post-cath, though overall improved from his presenting EKG.  Symptoms now resolved.  Will monitor closely for recurrent symptoms as well as the trend in his cardiac enzymes (currently > 60).  Lance Morin, MD

## 2016-06-15 NOTE — Progress Notes (Signed)
Dr.Gilbert informed of new discomfort/burning sensation orders received

## 2016-06-16 ENCOUNTER — Inpatient Hospital Stay (HOSPITAL_COMMUNITY): Payer: 59

## 2016-06-16 DIAGNOSIS — I213 ST elevation (STEMI) myocardial infarction of unspecified site: Secondary | ICD-10-CM

## 2016-06-16 DIAGNOSIS — E1165 Type 2 diabetes mellitus with hyperglycemia: Secondary | ICD-10-CM | POA: Diagnosis not present

## 2016-06-16 DIAGNOSIS — I420 Dilated cardiomyopathy: Secondary | ICD-10-CM | POA: Diagnosis not present

## 2016-06-16 DIAGNOSIS — Z6841 Body Mass Index (BMI) 40.0 and over, adult: Secondary | ICD-10-CM | POA: Diagnosis not present

## 2016-06-16 DIAGNOSIS — I2119 ST elevation (STEMI) myocardial infarction involving other coronary artery of inferior wall: Secondary | ICD-10-CM | POA: Diagnosis not present

## 2016-06-16 LAB — ECHOCARDIOGRAM COMPLETE
Height: 70 in
WEIGHTICAEL: 4510.4 [oz_av]

## 2016-06-16 LAB — GLUCOSE, CAPILLARY
Glucose-Capillary: 360 mg/dL — ABNORMAL HIGH (ref 65–99)
Glucose-Capillary: 353 mg/dL — ABNORMAL HIGH (ref 65–99)
Glucose-Capillary: 277 mg/dL — ABNORMAL HIGH (ref 65–99)

## 2016-06-16 MED ORDER — METFORMIN HCL 500 MG PO TABS
500.0000 mg | ORAL_TABLET | Freq: Two times a day (BID) | ORAL | Status: DC
  Administered 2016-06-16 – 2016-06-17 (×4): 500 mg via ORAL
  Filled 2016-06-16 (×5): qty 1

## 2016-06-16 MED ORDER — PERFLUTREN LIPID MICROSPHERE
INTRAVENOUS | Status: AC
  Administered 2016-06-16: 12:00:00
  Filled 2016-06-16: qty 10

## 2016-06-16 MED ORDER — INSULIN ASPART 100 UNIT/ML ~~LOC~~ SOLN
0.0000 [IU] | Freq: Three times a day (TID) | SUBCUTANEOUS | Status: DC
  Administered 2016-06-16 (×2): 15 [IU] via SUBCUTANEOUS
  Administered 2016-06-17: 11 [IU] via SUBCUTANEOUS
  Administered 2016-06-17 – 2016-06-18 (×2): 8 [IU] via SUBCUTANEOUS
  Administered 2016-06-18: 11 [IU] via SUBCUTANEOUS
  Administered 2016-06-18: 3 [IU] via SUBCUTANEOUS
  Administered 2016-06-19 (×2): 5 [IU] via SUBCUTANEOUS
  Administered 2016-06-19 – 2016-06-20 (×2): 3 [IU] via SUBCUTANEOUS
  Administered 2016-06-20: 5 [IU] via SUBCUTANEOUS

## 2016-06-16 NOTE — Progress Notes (Signed)
  Echocardiogram 2D Echocardiogram has been performed.  Janalyn Harder 06/16/2016, 12:09 PM

## 2016-06-16 NOTE — Progress Notes (Addendum)
CARDIOLOGY ROUNDING NOTE   ID: 60 y/o obese male admitted with inferior STEMI and heavy clot burden. S/p PCI/DES to RCA on 7/28  Subjective:    Feels well. Ambulated unit several times without CP. Echo results pending. CBGs very high.    Intake/Output Summary (Last 24 hours) at 06/16/16 1004 Last data filed at 06/16/16 0900  Gross per 24 hour  Intake             2003 ml  Output                0 ml  Net             2003 ml    Current meds: . aspirin  81 mg Oral Daily  . atorvastatin  80 mg Oral q1800  . enoxaparin (LOVENOX) injection  40 mg Subcutaneous Q24H  . metoprolol tartrate  12.5 mg Oral BID  . sodium chloride flush  3 mL Intravenous Q12H  . ticagrelor  90 mg Oral BID   Infusions:     Objective:  Blood pressure (!) 126/92, pulse 80, temperature 97.9 F (36.6 C), temperature source Oral, resp. rate (!) 21, height  (1.778 m), weight 127.9 kg (281 lb 14.4 oz), SpO2 97 %. Weight change: 0.269 kg (9.5 oz)  Physical Exam: General:  Sitting in chair Well appearing. No resp difficulty HEENT: normal Neck: supple. JVP not elevated . Carotids 2+ bilat; no bruits. No lymphadenopathy or thryomegaly appreciated. Cor: PMI nondisplaced. Regular rate & rhythm. No rubs, gallops or murmurs. Lungs: clear Abdomen: obese, soft, nontender, nondistended. No hepatosplenomegaly. No bruits or masses. Good bowel sounds. Extremities: no cyanosis, clubbing, rash, edema R groin hematoma (from temp pacer) Neuro: alert & orientedx3, cranial nerves grossly intact. moves all 4 extremities w/o difficulty. Affect pleasant  Telemetry: NSR  Lab Results: Basic Metabolic Panel:  Recent Labs Lab 06/14/16 1115 06/15/16 0225  NA 137 132*  K 3.6 4.1  CL 102 102  CO2 26 22  GLUCOSE 300* 314*  BUN 16 17  CREATININE 1.12 1.04  CALCIUM 8.5* 8.0*   Liver Function Tests:  Recent Labs Lab 06/15/16 0225  AST 163*  ALT 60  ALKPHOS 46  BILITOT 0.8  PROT 5.6*  ALBUMIN 3.2*   No  results for input(s): LIPASE, AMYLASE in the last 168 hours. No results for input(s): AMMONIA in the last 168 hours. CBC:  Recent Labs Lab 06/14/16 1115 06/14/16 2042 06/15/16 0225  WBC 10.3  --  11.9*  HGB 15.7  --  13.1  HCT 45.8  --  39.8  MCV 87.4  --  90.5  PLT 193 175 169   Cardiac Enzymes:  Recent Labs Lab 06/14/16 1115 06/14/16 1511 06/14/16 2042 06/15/16 0225 06/15/16 1250  CKTOTAL  --  2,269* 2,712* 1,873*  --   CKMB  --  138.3* 163.1* 115.1*  --   TROPONINI 2.32* 21.98* >65.00* 34.87* 17.33*   BNP: Invalid input(s): POCBNP CBG: No results for input(s): GLUCAP in the last 168 hours. Microbiology: No results found for: CULT No results for input(s): CULT, SDES in the last 168 hours.  Imaging: Dg Chest Portable 1 View  Result Date: 06/14/2016 CLINICAL DATA:  Chest pain beginning this morning.  Sweating. EXAM: PORTABLE CHEST 1 VIEW COMPARISON:  None. FINDINGS: Lungs are clear. Lung volumes are somewhat low. Heart size is normal. No pneumothorax or pleural effusion. IMPRESSION: No acute disease. Electronically Signed   By: Drusilla Kanner M.D.   On: 06/14/2016 11:43  ASSESSMENT:  1. Inferior STEMI, peak trop > 65  --large clot burden so treated with tirofiban post cath x 18 hours  --s/p DES to RCA 7/28  --echo pending  --cardiac rehab  --ECG with persistent ST elevation inferiorly.  2. CAD   --cath with RCA 100%, LAD 10%, LCX ok   --continue ASA/Brilinta, statin, b-blocker 3. Morbid obesity 4. DVT    --about 2 years ago after long plane ride. Treated with Eliquis x 6 months. Now off.  5. DM2,     --this is new diagnosis for him. CBGs very high. HgbA1c 10.6. Start SSI. Will start metformin (he is 2 days post-cath). DM2 teaching consult. May need insulin. Will need weight loss and increased activity.   PLAN/DISCUSSION:  Progressing well post MI. Will transfer to floor. Continue current regimen and CR. Echo pending.   DM2 is new. Start SSI. Will  start metformin (he is 2 days post-cath). DM2 teaching consult.  Likely home tomorrow.    LOS: 2 days   Arvilla Meres, MD 06/16/2016, 10:04 AM

## 2016-06-17 ENCOUNTER — Encounter (HOSPITAL_COMMUNITY): Payer: Self-pay | Admitting: Physician Assistant

## 2016-06-17 DIAGNOSIS — E1165 Type 2 diabetes mellitus with hyperglycemia: Secondary | ICD-10-CM | POA: Diagnosis not present

## 2016-06-17 DIAGNOSIS — Z6841 Body Mass Index (BMI) 40.0 and over, adult: Secondary | ICD-10-CM | POA: Diagnosis not present

## 2016-06-17 DIAGNOSIS — I82419 Acute embolism and thrombosis of unspecified femoral vein: Secondary | ICD-10-CM | POA: Diagnosis not present

## 2016-06-17 DIAGNOSIS — I2119 ST elevation (STEMI) myocardial infarction involving other coronary artery of inferior wall: Secondary | ICD-10-CM | POA: Diagnosis not present

## 2016-06-17 DIAGNOSIS — Z794 Long term (current) use of insulin: Secondary | ICD-10-CM

## 2016-06-17 DIAGNOSIS — E1159 Type 2 diabetes mellitus with other circulatory complications: Secondary | ICD-10-CM

## 2016-06-17 DIAGNOSIS — I209 Angina pectoris, unspecified: Secondary | ICD-10-CM | POA: Diagnosis not present

## 2016-06-17 DIAGNOSIS — I420 Dilated cardiomyopathy: Secondary | ICD-10-CM | POA: Diagnosis not present

## 2016-06-17 LAB — GLUCOSE, CAPILLARY
GLUCOSE-CAPILLARY: 378 mg/dL — AB (ref 65–99)
GLUCOSE-CAPILLARY: 303 mg/dL — AB (ref 65–99)
GLUCOSE-CAPILLARY: 273 mg/dL — AB (ref 65–99)
Glucose-Capillary: 285 mg/dL — ABNORMAL HIGH (ref 65–99)

## 2016-06-17 LAB — BASIC METABOLIC PANEL
ANION GAP: 9 (ref 5–15)
BUN: 16 mg/dL (ref 6–20)
CALCIUM: 8.8 mg/dL — AB (ref 8.9–10.3)
CO2: 25 mmol/L (ref 22–32)
Chloride: 101 mmol/L (ref 101–111)
Creatinine, Ser: 1.06 mg/dL (ref 0.61–1.24)
Glucose, Bld: 269 mg/dL — ABNORMAL HIGH (ref 65–99)
POTASSIUM: 3.6 mmol/L (ref 3.5–5.1)
Sodium: 135 mmol/L (ref 135–145)

## 2016-06-17 LAB — CBC
HEMATOCRIT: 39.9 % (ref 39.0–52.0)
HEMOGLOBIN: 13.1 g/dL (ref 13.0–17.0)
MCH: 29.8 pg (ref 26.0–34.0)
MCHC: 32.8 g/dL (ref 30.0–36.0)
MCV: 90.9 fL (ref 78.0–100.0)
Platelets: 161 10*3/uL (ref 150–400)
RBC: 4.39 MIL/uL (ref 4.22–5.81)
RDW: 13.5 % (ref 11.5–15.5)
WBC: 12.9 10*3/uL — AB (ref 4.0–10.5)

## 2016-06-17 LAB — TROPONIN I
TROPONIN I: 15.34 ng/mL — AB (ref <0.03)
Troponin I: 14.23 ng/mL (ref <0.03)
Troponin I: 15.06 ng/mL (ref <0.03)

## 2016-06-17 LAB — BRAIN NATRIURETIC PEPTIDE: B NATRIURETIC PEPTIDE 5: 223.3 pg/mL — AB (ref 0.0–100.0)

## 2016-06-17 MED ORDER — FUROSEMIDE 10 MG/ML IJ SOLN
20.0000 mg | Freq: Once | INTRAMUSCULAR | Status: AC
  Administered 2016-06-17: 20 mg via INTRAVENOUS
  Filled 2016-06-17: qty 2

## 2016-06-17 MED ORDER — INSULIN GLARGINE 100 UNIT/ML ~~LOC~~ SOLN
25.0000 [IU] | Freq: Every day | SUBCUTANEOUS | Status: DC
  Administered 2016-06-17 – 2016-06-20 (×4): 25 [IU] via SUBCUTANEOUS
  Filled 2016-06-17 (×4): qty 0.25

## 2016-06-17 MED ORDER — HEPARIN (PORCINE) IN NACL 100-0.45 UNIT/ML-% IJ SOLN
1700.0000 [IU]/h | INTRAMUSCULAR | Status: DC
  Administered 2016-06-17: 1300 [IU]/h via INTRAVENOUS
  Filled 2016-06-17 (×2): qty 250

## 2016-06-17 MED ORDER — LIVING WELL WITH DIABETES BOOK
Freq: Once | Status: AC
  Administered 2016-06-17: 10:00:00
  Filled 2016-06-17: qty 1

## 2016-06-17 MED ORDER — INSULIN STARTER KIT- PEN NEEDLES (ENGLISH)
1.0000 | Freq: Once | Status: AC
  Administered 2016-06-18: 1
  Filled 2016-06-17 (×2): qty 1

## 2016-06-17 MED ORDER — LISINOPRIL 5 MG PO TABS
5.0000 mg | ORAL_TABLET | Freq: Every day | ORAL | Status: DC
  Administered 2016-06-17: 5 mg via ORAL
  Filled 2016-06-17: qty 1

## 2016-06-17 MED ORDER — CARVEDILOL 6.25 MG PO TABS
6.2500 mg | ORAL_TABLET | Freq: Two times a day (BID) | ORAL | Status: DC
  Administered 2016-06-17 – 2016-06-18 (×3): 6.25 mg via ORAL
  Filled 2016-06-17 (×3): qty 1

## 2016-06-17 MED ORDER — CARVEDILOL 3.125 MG PO TABS: 3.1250 mg | ORAL_TABLET | Freq: Two times a day (BID) | ORAL | Status: DC

## 2016-06-17 MED FILL — Nitroglycerin IV Soln 100 MCG/ML in D5W: INTRA_ARTERIAL | Qty: 10 | Status: AC

## 2016-06-17 NOTE — Progress Notes (Addendum)
Paged by nursing staff regarding recurrent chest pain for Mr. Tyler Harper, he had the chest pressure recurred around 10AM this morning, prior to that, he was walking without discomfort. He mentions increasing SOB since yesterday, he says it is worse with laying down and so it chest pain, and better with sitting. His current chest pressure is also more noticeable with deep inspiration. It has been persistent for the past 4 hours.   EKG repeated, showing persistent ST elevation in inferior leads, but appears to be improving. He does have more prominent ST elevation in lateral leads compare to last EKG. Will repeat trop. He says his current chest pain is 5/10 about the same as degree of chest pain on initial hospital presentation. He is on ASA and brilinta. I will discuss with MD, some of the symptom concerning for beginning of HF, will check BNP. I will also check a trop, last trop this morning was still trending down.   Ramond Dial PA Pager: [phone]  Reviewed EKG with Dr. Allyson Sabal, very difficult to tell if there is any evidence of acute in-stent thrombosis, Dr. Allyson Sabal recommended delay discharge, transfer to [facility] overnight for observation. Serial trop. Discussed with Dr. Mayford Knife, will given him 20mg  IV lasix to see response esp given SOB when laying down concerning for HF. Start IV heparin.  Ramond Dial PA Pager: [phone]   Despite prolonged chest pain, trop is trending down. BNP mildly elevated, will monitor his symptom after IV lasix.  Ramond Dial PA Pager: [phone]

## 2016-06-17 NOTE — Progress Notes (Signed)
Patient arrived from 2W via wheelchair accompanied by RN. Pt alert, oriented, and in no apparent discomfort or distress. Pt confirms presence of chest discomfort (same level and intensity as earlier) but denies wanting any pain medication for it. Patient's wife at the bedside. Patient able to ambulate to chair in room with stand by assistance. VSS. Call light within reach.  Will continue to monitor. Asher Muir Aldrich Lloyd,RN

## 2016-06-17 NOTE — Progress Notes (Signed)
Brilinta card given to the patient as requested. Abelino Derrick [facility] [phone]

## 2016-06-17 NOTE — Progress Notes (Signed)
Patient complains of Chest discomfort and pressure and SOB since about 10am.  Since it has not improved he alerted nursing staff.  CP 5/10  He feels worse when he is lying down, better sitting in the chair.  12 lead EKG done by staff.  Azalee Course PA at bedside to assess patient.  RN to call if assistance needed.

## 2016-06-17 NOTE — Progress Notes (Signed)
Pt called out c/o SOB and chest pain with deep breaths.  Pt BP assessed, one dose of SL nitroglycerine administered, and EKG obtained.  ST segment elevation noted.  O2 applied at 2L, MD and rapid response paged. Pt VSS, noted mild relief s/p nitro administration. Next dose held due to decreased BP of 96/63.

## 2016-06-17 NOTE — Progress Notes (Addendum)
ANTICOAGULATION CONSULT NOTE - Initial Consult  Pharmacy Consult for heparin Indication: chest pain/ACS  No Known Allergies  Patient Measurements: Height: 5' 10"  (177.8 cm) Weight: 279 lb (126.6 kg) IBW/kg (Calculated) : 73 Heparin Dosing Weight: 101.8 kg  Vital Signs: Temp: 98.3 F (36.8 C) (07/31 1324) Temp Source: Oral (07/31 1324) BP: 96/63 (07/31 1350) Pulse Rate: 73 (07/31 1350)  Labs:  Recent Labs  06/14/16 2042 06/15/16 0225 06/15/16 1250 06/17/16 0308 06/17/16 0917  HGB  --  13.1  --  13.1  --   HCT  --  39.8  --  39.9  --   PLT 175 169  --  161  --   CREATININE  --  1.04  --  1.06  --   CKTOTAL 2,712* 1,873*  --   --   --   CKMB 163.1* 115.1*  --   --   --   TROPONINI >65.00* 34.87* 17.33*  --  15.34*    Estimated Creatinine Clearance: 99 mL/min (by C-G formula based on SCr of 1.06 mg/dL).   Medical History: Past Medical History:  Diagnosis Date  . CAD (coronary artery disease)   . Deep vein thrombosis (DVT) ([place]) 2015  . Diabetes mellitus type 2 in obese ([facility])     Medications:  Scheduled:  . aspirin  81 mg Oral Daily  . atorvastatin  80 mg Oral q1800  . carvedilol  6.25 mg Oral BID WC  . furosemide  20 mg Intravenous Once  . insulin aspart  0-15 Units Subcutaneous TID WC  . insulin glargine  25 Units Subcutaneous Daily  . insulin starter kit- pen needles  1 kit Other Once  . lisinopril  5 mg Oral Daily  . metFORMIN  500 mg Oral BID WC  . sodium chloride flush  3 mL Intravenous Q12H  . ticagrelor  90 mg Oral BID   Infusions:    Assessment: 60 yo male with chest pain will be switched from lovenox SQ (VTE px dosing) to treatment dose of heparin.  Patient got lovenox 40 mg x1 at 1222 today.  Goal of Therapy:  Heparin level 0.3-0.7 units/ml Monitor platelets by anticoagulation protocol: Yes   Plan:  - d/c lovenox - Heparin 1300 units/hr. No bolus. - 6hr heparin level  - daily heparin level  Edman Lipsey, Tsz-Yin 06/17/2016,3:34 PM

## 2016-06-17 NOTE — Progress Notes (Addendum)
SUBJECTIVE:  Mild chest discomfort with deep inspiration  OBJECTIVE:   Vitals:   Vitals:   06/16/16 1356 06/16/16 1424 06/16/16 2109 06/17/16 0541  BP: 121/86 124/71 123/77 120/76  Pulse:  76 76 80  Resp:  Temp:  98.2 F (36.8 C) 98.3 F (36.8 C) 97.6 F (36.4 C)  TempSrc:  Oral Oral Oral  SpO2:  98% 96% 96%  Weight:  281 lb 9.6 oz (127.7 kg)  279 lb (126.6 kg)  Height:       I&O's:   Intake/Output Summary (Last 24 hours) at 06/17/16 0827 Last data filed at 06/16/16 1425  Gross per 24 hour  Intake              420 ml  Output                0 ml  Net              420 ml   TELEMETRY: Reviewed telemetry pt in NSR:     PHYSICAL EXAM General: Well developed, well nourished, in no acute distress Head: Eyes PERRLA, No xanthomas.   Normal cephalic and atramatic  Lungs:   Clear bilaterally to auscultation and percussion. Heart:   HRRR S1 S2 Pulses are 2+ & equal.            No carotid bruit. No JVD.  No abdominal bruits. No femoral bruits. Abdomen: Bowel sounds are positive, abdomen soft and non-tender without masses  Msk:  Back normal, normal gait. Normal strength and tone for age. Extremities:   No clubbing, cyanosis or edema.  DP +1 Neuro: Alert and oriented X 3. Psych:  Good affect, responds appropriately   LABS: Basic Metabolic Panel:  Recent Labs  16/10/96 0225 06/17/16 0308  NA 132* 135  K 4.1 3.6  CL 102 101  CO2 22 25  GLUCOSE 314* 269*  BUN 17 16  CREATININE 1.04 1.06  CALCIUM 8.0* 8.8*   Liver Function Tests:  Recent Labs  06/15/16 0225  AST 163*  ALT 60  ALKPHOS 46  BILITOT 0.8  PROT 5.6*  ALBUMIN 3.2*   No results for input(s): LIPASE, AMYLASE in the last 72 hours. CBC:  Recent Labs  06/15/16 0225 06/17/16 0308  WBC 11.9* 12.9*  HGB 13.1 13.1  HCT 39.8 39.9  MCV 90.5 90.9  PLT 169 161   Cardiac Enzymes:  Recent Labs  06/14/16 1511 06/14/16 2042 06/15/16 0225 06/15/16 1250  CKTOTAL 2,269* 2,712* 1,873*  --     CKMB 138.3* 163.1* 115.1*  --   TROPONINI 21.98* >65.00* 34.87* 17.33*   BNP: Invalid input(s): POCBNP D-Dimer: No results for input(s): DDIMER in the last 72 hours. Hemoglobin A1C:  Recent Labs  06/14/16 1512  HGBA1C 10.6*   Fasting Lipid Panel: No results for input(s): CHOL, HDL, LDLCALC, TRIG, CHOLHDL, LDLDIRECT in the last 72 hours. Thyroid Function Tests:  Recent Labs  06/14/16 1511  TSH 1.340   Anemia Panel: No results for input(s): VITAMINB12, FOLATE, FERRITIN, TIBC, IRON, RETICCTPCT in the last 72 hours. Coag Panel:   No results found for: INR, PROTIME  RADIOLOGY: Dg Chest Portable 1 View  Result Date: 06/14/2016 CLINICAL DATA:  Chest pain beginning this morning.  Sweating. EXAM: PORTABLE CHEST 1 VIEW COMPARISON:  None. FINDINGS: Lungs are clear. Lung volumes are somewhat low. Heart size is normal. No pneumothorax or pleural effusion. IMPRESSION: No acute disease. Electronically Signed   By: Drusilla Kanner M.D.  On: 06/14/2016 11:43   ASSESSMENT/PLAN:  1. Inferior STEMI, peak trop > 65 now trending downward.  --large clot burden so treated with tirofiban post cath x 18 hours  --s/p DES to RCA 7/28  --echo with severe LV dysfunction with EF 25-30% with AK of the basal and mid inferior wall and HK of the inferolateral and inferoseptal walls as well as apical inferior wall and moderately reduced RVF.    --cardiac rehab  --continue ASA/Brilinta/BB 2. CAD   --cath with RCA 100%, LAD 10%, LCX ok   --continue ASA/Brilinta, statin, b-blocker.  Will change from Lopressor to Coreg 6.25mg  BID. 3. Morbid obesity 4. DVT    --about 2 years ago after long plane ride. Treated with Eliquis x 6 months. Now off.  5. DM2,     --this is new diagnosis for him. CBGs very high. HgbA1c 10.6. Start SSI. Started metformin yesterday. DM2 teaching consult. Will need weight loss and increased activity. Needs early followup with PCP. 6. 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  She will need a lifevest for 2 months and repeat 2D echo to reassess LVF post revascularization.  Continue BB but change from Lopressor to Coreg 6.25mg  BID and add Lisinopril 5mg  daily.  Will order Lifevest and if available today then ok for patient to be discharged home with early TOC followup in our office.       Armanda Magic, MD  06/17/2016  8:27 AM

## 2016-06-17 NOTE — Progress Notes (Addendum)
Inpatient Diabetes Program Recommendations  AACE/ADA: New Consensus Statement on Inpatient Glycemic Control (2015)  Target Ranges:  Prepandial:   less than 140 mg/dL      Peak postprandial:   less than 180 mg/dL (1-2 hours)      Critically ill patients:  140 - 180 mg/dL   Lab Results  Component Value Date   GLUCAP 378 (H) 06/17/2016   HGBA1C 10.6 (H) 06/14/2016    Review of Glycemic Control:  Results for Tyler Harper, Tyler Harper (MRN [id number]) as of 06/17/2016 09:56  Ref. Range 06/16/2016 11:41 06/16/2016 17:07 06/16/2016 21:08 06/17/2016 08:26  Glucose-Capillary Latest Ref Range: 65 - 99 mg/dL 360 (H) 353 (H) 277 (H) 378 (H)    Diabetes history: New onset diabetes Outpatient Diabetes medications: None Current orders for Inpatient glycemic control:  Novolog moderate tid with meals  Inpatient Diabetes Program Recommendations:    Note new diagnosis of diabetes.  A1C>10%. Blood sugars continue to be elevated.  Referral received.  Survival skill education ordered and to be completed by bedside nurse.  Recommend the addition of basal insulin at 0.2 units/kg which is approximately 25 units of Lantus daily. Called and discussed with PA and orders received.  Will see patient and teach use of insulin pen.  At discharge please order Lantus Solostar pen and insulin pen needles (order # [id number]) along with metformin.  Patient also needs order for glucose meter kit (order # [id number])RSWNIOE has follow-up appointment with PCP on 06/18/16.   Addendum:  Spoke with patient and wife regarding new diagnosis of diabetes. Discussed type 2 diabetes, normal blood sugars, symptoms of high and low blood sugars, and how to treat hypoglycemia.  Demonstrated use of insulin pen and patient was able to return demonstration including 2 unit prime, holding for 6-10 seconds and rotation of sites. Also discussed monitoring at least 3-4 times daily.  He seems very motivated and states I am going to change my lifestyle.  He plans to go to  cardiac rehab.  He is also interested in going to outpatient diabetes education.  MD, please add "diagnosis" of diabetes so that outpatient diabetes education can be ordered. Patient and wife very engaged.  RN in room getting ready to help patient administer his first dose of Lantus.    Thanks, Adah Perl, RN, BC-ADM Inpatient Diabetes Coordinator Pager [phone] (8a-5p)

## 2016-06-17 NOTE — Progress Notes (Signed)
CARDIAC REHAB PHASE I   PRE:  Rate/Rhythm: 94 SR  BP:  Sitting: 127/78        SaO2: 96 RA  MODE:  Ambulation: 550 ft   POST:  Rate/Rhythm: 113 ST  BP:  Sitting: 144/89         SaO2: 99 RA  Pt ambulated 550 ft on RA, independent, steady gait, tolerated well.  Pt c/o mild DOE, denies cp, dizziness, declined rest stop. Completed MI/stent education with pt and wife at bedside.  Reviewed risk factors, MI book, anti-platelet therapy, stent card, activity restrictions, ntg, exercise, heart healthy diet, carb counting, portion control, sodium restrictions, CHF booklet and zone tool, daily weights and phase 2 cardiac rehab. Left instructions to view life vest video/MI video/diabetes videos. Pt and wife verbalized understanding. Pt agrees to phase 2 cardiac rehab referral, referral sent to [facility]. Pt to recliner after walk, call bell within reach.  [phone] Joylene Grapes, RN, BSN 06/17/2016 9:29 AM

## 2016-06-18 ENCOUNTER — Encounter (HOSPITAL_COMMUNITY): Payer: Self-pay | Admitting: Cardiology

## 2016-06-18 ENCOUNTER — Encounter (HOSPITAL_COMMUNITY)
Admission: EM | Disposition: A | Discharge status: Home / Self Care | Payer: Self-pay | Source: Home / Self Care | Attending: Interventional Cardiology

## 2016-06-18 DIAGNOSIS — Z6841 Body Mass Index (BMI) 40.0 and over, adult: Secondary | ICD-10-CM | POA: Diagnosis not present

## 2016-06-18 DIAGNOSIS — I82419 Acute embolism and thrombosis of unspecified femoral vein: Secondary | ICD-10-CM | POA: Diagnosis not present

## 2016-06-18 DIAGNOSIS — I2119 ST elevation (STEMI) myocardial infarction involving other coronary artery of inferior wall: Secondary | ICD-10-CM | POA: Diagnosis not present

## 2016-06-18 DIAGNOSIS — I25110 Atherosclerotic heart disease of native coronary artery with unstable angina pectoris: Secondary | ICD-10-CM

## 2016-06-18 DIAGNOSIS — I420 Dilated cardiomyopathy: Secondary | ICD-10-CM

## 2016-06-18 DIAGNOSIS — E785 Hyperlipidemia, unspecified: Secondary | ICD-10-CM

## 2016-06-18 DIAGNOSIS — I82409 Acute embolism and thrombosis of unspecified deep veins of unspecified lower extremity: Secondary | ICD-10-CM

## 2016-06-18 DIAGNOSIS — I209 Angina pectoris, unspecified: Secondary | ICD-10-CM

## 2016-06-18 DIAGNOSIS — E1165 Type 2 diabetes mellitus with hyperglycemia: Secondary | ICD-10-CM | POA: Diagnosis not present

## 2016-06-18 DIAGNOSIS — R079 Chest pain, unspecified: Secondary | ICD-10-CM

## 2016-06-18 HISTORY — DX: Dilated cardiomyopathy: I42.0

## 2016-06-18 HISTORY — PX: CARDIAC CATHETERIZATION: SHX172

## 2016-06-18 LAB — CBC
HEMATOCRIT: 37.1 % — AB (ref 39.0–52.0)
Hemoglobin: 12.4 g/dL — ABNORMAL LOW (ref 13.0–17.0)
MCH: 30.2 pg (ref 26.0–34.0)
MCHC: 33.4 g/dL (ref 30.0–36.0)
MCV: 90.5 fL (ref 78.0–100.0)
Platelets: 178 10*3/uL (ref 150–400)
RBC: 4.1 MIL/uL — ABNORMAL LOW (ref 4.22–5.81)
RDW: 13.8 % (ref 11.5–15.5)
WBC: 12.6 10*3/uL — ABNORMAL HIGH (ref 4.0–10.5)

## 2016-06-18 LAB — GLUCOSE, CAPILLARY
GLUCOSE-CAPILLARY: 301 mg/dL — AB (ref 65–99)
GLUCOSE-CAPILLARY: 182 mg/dL — AB (ref 65–99)
GLUCOSE-CAPILLARY: 196 mg/dL — AB (ref 65–99)
Glucose-Capillary: 252 mg/dL — ABNORMAL HIGH (ref 65–99)

## 2016-06-18 LAB — POCT ACTIVATED CLOTTING TIME
ACTIVATED CLOTTING TIME: 241 s
ACTIVATED CLOTTING TIME: 395 s
Activated Clotting Time: 252 seconds

## 2016-06-18 LAB — HEPARIN LEVEL (UNFRACTIONATED)
HEPARIN UNFRACTIONATED: 0.37 [IU]/mL (ref 0.30–0.70)
Heparin Unfractionated: 0.25 IU/mL — ABNORMAL LOW (ref 0.30–0.70)
Heparin Unfractionated: 0.23 IU/mL — ABNORMAL LOW (ref 0.30–0.70)

## 2016-06-18 LAB — PROTIME-INR
INR: 1.09
PROTHROMBIN TIME: 14.2 s (ref 11.4–15.2)

## 2016-06-18 SURGERY — LEFT HEART CATH AND CORONARY ANGIOGRAPHY

## 2016-06-18 MED ORDER — HEPARIN SODIUM (PORCINE) 1000 UNIT/ML IJ SOLN
INTRAMUSCULAR | Status: AC
  Filled 2016-06-18: qty 1

## 2016-06-18 MED ORDER — HEPARIN (PORCINE) IN NACL 2-0.9 UNIT/ML-% IJ SOLN
INTRAMUSCULAR | Status: AC
  Filled 2016-06-18: qty 1000

## 2016-06-18 MED ORDER — IOPAMIDOL (ISOVUE-370) INJECTION 76%
INTRAVENOUS | Status: AC
  Filled 2016-06-18: qty 100

## 2016-06-18 MED ORDER — SODIUM CHLORIDE 0.9 % IV SOLN: 250.0000 mL | INTRAVENOUS | Status: DC | PRN

## 2016-06-18 MED ORDER — VERAPAMIL HCL 2.5 MG/ML IV SOLN
INTRAVENOUS | Status: AC
  Filled 2016-06-18: qty 2

## 2016-06-18 MED ORDER — HEPARIN SODIUM (PORCINE) 1000 UNIT/ML IJ SOLN
INTRAMUSCULAR | Status: DC | PRN
  Administered 2016-06-18: 2000 [IU] via INTRAVENOUS
  Administered 2016-06-18 (×2): 5000 [IU] via INTRAVENOUS
  Administered 2016-06-18: 3000 [IU] via INTRAVENOUS

## 2016-06-18 MED ORDER — HEPARIN SODIUM (PORCINE) 1000 UNIT/ML IJ SOLN
INTRAMUSCULAR | Status: AC
Start: 2016-06-18 — End: 2016-06-18
  Filled 2016-06-18: qty 1

## 2016-06-18 MED ORDER — ADENOSINE (DIAGNOSTIC) FOR INTRACORONARY USE
INTRAVENOUS | Status: DC | PRN
  Administered 2016-06-18 (×3): 36 ug via INTRACORONARY

## 2016-06-18 MED ORDER — SODIUM CHLORIDE 0.9% FLUSH: 3.0000 mL | INTRAVENOUS | Status: DC | PRN

## 2016-06-18 MED ORDER — ADENOSINE 6 MG/2ML IV SOLN
INTRAVENOUS | Status: AC
  Filled 2016-06-18: qty 2

## 2016-06-18 MED ORDER — TIROFIBAN HCL IN NACL 5-0.9 MG/100ML-% IV SOLN
0.1500 ug/kg/min | INTRAVENOUS | Status: AC
  Administered 2016-06-18 – 2016-06-19 (×4): 0.15 ug/kg/min via INTRAVENOUS
  Filled 2016-06-18 (×4): qty 100

## 2016-06-18 MED ORDER — HEPARIN BOLUS VIA INFUSION
1500.0000 [IU] | Freq: Once | INTRAVENOUS | Status: AC
Start: 2016-06-18 — End: 2016-06-18
  Administered 2016-06-18: 1500 [IU] via INTRAVENOUS
  Filled 2016-06-18: qty 1500

## 2016-06-18 MED ORDER — PNEUMOCOCCAL VAC POLYVALENT 25 MCG/0.5ML IJ INJ: 0.5000 mL | INJECTION | INTRAMUSCULAR | Status: DC

## 2016-06-18 MED ORDER — TIROFIBAN HCL IN NACL 5-0.9 MG/100ML-% IV SOLN
INTRAVENOUS | Status: DC | PRN
  Administered 2016-06-18 (×2): 0.15 ug/kg/min via INTRAVENOUS

## 2016-06-18 MED ORDER — TIROFIBAN HCL IN NACL 5-0.9 MG/100ML-% IV SOLN
INTRAVENOUS | Status: AC
  Filled 2016-06-18: qty 100

## 2016-06-18 MED ORDER — HEPARIN (PORCINE) IN NACL 2-0.9 UNIT/ML-% IJ SOLN
INTRAMUSCULAR | Status: DC | PRN
  Administered 2016-06-18: 1500 mL

## 2016-06-18 MED ORDER — SODIUM CHLORIDE 0.9% FLUSH
3.0000 mL | Freq: Two times a day (BID) | INTRAVENOUS | Status: DC
Start: 2016-06-18 — End: 2016-06-18

## 2016-06-18 MED ORDER — TIROFIBAN HCL IN NACL 5-0.9 MG/100ML-% IV SOLN
0.1500 ug/kg/min | INTRAVENOUS | Status: DC
  Administered 2016-06-18: 0.15 ug/kg/min via INTRAVENOUS
  Filled 2016-06-18 (×3): qty 100

## 2016-06-18 MED ORDER — MIDAZOLAM HCL 2 MG/2ML IJ SOLN
INTRAMUSCULAR | Status: DC | PRN
  Administered 2016-06-18: 1 mg via INTRAVENOUS

## 2016-06-18 MED ORDER — IOPAMIDOL (ISOVUE-370) INJECTION 76%
INTRAVENOUS | Status: DC | PRN
  Administered 2016-06-18: 150 mL via INTRA_ARTERIAL

## 2016-06-18 MED ORDER — MIDAZOLAM HCL 2 MG/2ML IJ SOLN
INTRAMUSCULAR | Status: AC
  Filled 2016-06-18: qty 2

## 2016-06-18 MED ORDER — HEPARIN (PORCINE) IN NACL 2-0.9 UNIT/ML-% IJ SOLN
INTRAMUSCULAR | Status: AC
  Filled 2016-06-18: qty 500

## 2016-06-18 MED ORDER — TIROFIBAN (AGGRASTAT) BOLUS VIA INFUSION
INTRAVENOUS | Status: DC | PRN
  Administered 2016-06-18: 3182.5 ug via INTRAVENOUS

## 2016-06-18 MED ORDER — SODIUM CHLORIDE 0.9 % IV SOLN
INTRAVENOUS | Status: AC
  Administered 2016-06-18: 16:00:00 via INTRAVENOUS

## 2016-06-18 MED ORDER — ASPIRIN 81 MG PO CHEW: 81.0000 mg | CHEWABLE_TABLET | ORAL | Status: DC

## 2016-06-18 MED ORDER — CARVEDILOL 3.125 MG PO TABS
3.1250 mg | ORAL_TABLET | Freq: Two times a day (BID) | ORAL | Status: DC
  Administered 2016-06-18 – 2016-06-20 (×4): 3.125 mg via ORAL
  Filled 2016-06-18 (×4): qty 1

## 2016-06-18 MED ORDER — LISINOPRIL 2.5 MG PO TABS
2.5000 mg | ORAL_TABLET | Freq: Every day | ORAL | Status: DC
  Administered 2016-06-18 – 2016-06-20 (×3): 2.5 mg via ORAL
  Filled 2016-06-18 (×3): qty 1

## 2016-06-18 MED ORDER — IOPAMIDOL (ISOVUE-370) INJECTION 76%
INTRAVENOUS | Status: AC
  Filled 2016-06-18: qty 50

## 2016-06-18 MED ORDER — IOPAMIDOL (ISOVUE-370) INJECTION 76%
INTRAVENOUS | Status: AC
Start: 2016-06-18 — End: 2016-06-18
  Filled 2016-06-18: qty 50

## 2016-06-18 MED ORDER — TICAGRELOR 90 MG PO TABS: 90.0000 mg | ORAL_TABLET | Freq: Two times a day (BID) | ORAL | Status: DC

## 2016-06-18 MED ORDER — HEPARIN (PORCINE) IN NACL 2-0.9 UNIT/ML-% IJ SOLN
INTRAMUSCULAR | Status: DC | PRN
  Administered 2016-06-18: 10 mL via INTRA_ARTERIAL

## 2016-06-18 MED ORDER — FUROSEMIDE 20 MG PO TABS
20.0000 mg | ORAL_TABLET | Freq: Every day | ORAL | Status: DC
  Administered 2016-06-18 – 2016-06-20 (×3): 20 mg via ORAL
  Filled 2016-06-18 (×3): qty 1

## 2016-06-18 MED ORDER — SODIUM CHLORIDE 0.9% FLUSH
3.0000 mL | Freq: Two times a day (BID) | INTRAVENOUS | Status: DC
  Administered 2016-06-18 – 2016-06-19 (×2): 3 mL via INTRAVENOUS

## 2016-06-18 MED ORDER — LIDOCAINE HCL (PF) 1 % IJ SOLN
INTRAMUSCULAR | Status: AC
  Filled 2016-06-18: qty 30

## 2016-06-18 MED ORDER — FENTANYL CITRATE (PF) 100 MCG/2ML IJ SOLN
INTRAMUSCULAR | Status: AC
  Filled 2016-06-18: qty 2

## 2016-06-18 MED ORDER — LIDOCAINE HCL (PF) 1 % IJ SOLN
INTRAMUSCULAR | Status: DC | PRN
  Administered 2016-06-18: 2 mL via INTRADERMAL

## 2016-06-18 MED ORDER — FENTANYL CITRATE (PF) 100 MCG/2ML IJ SOLN
INTRAMUSCULAR | Status: DC | PRN
  Administered 2016-06-18: 25 ug via INTRAVENOUS

## 2016-06-18 MED ORDER — SODIUM CHLORIDE 0.9 % IV SOLN
INTRAVENOUS | Status: DC
  Administered 2016-06-18: 11:00:00 via INTRAVENOUS

## 2016-06-18 SURGICAL SUPPLY — 21 items
BALLN EMERGE MR 2.5X12 (BALLOONS) ×3
BALLN EMERGE MR 3.0X20 (BALLOONS) ×3
BALLN ~~LOC~~ EMERGE MR 4.0X20 (BALLOONS) ×3
BALLOON EMERGE MR 2.5X12 (BALLOONS) IMPLANT
BALLOON EMERGE MR 3.0X20 (BALLOONS) IMPLANT
BALLOON ~~LOC~~ EMERGE MR 4.0X20 (BALLOONS) IMPLANT
CATH EXTRAC PRONTO 5.5F 138CM (CATHETERS) ×2 IMPLANT
CATH INFINITI 5 FR JL3.5 (CATHETERS) ×2 IMPLANT
CATH INFINITI 5FR ANG PIGTAIL (CATHETERS) ×2 IMPLANT
CATH INFINITI JR4 5F (CATHETERS) ×2 IMPLANT
CATH VISTA GUIDE 6FR JR4 (CATHETERS) ×2 IMPLANT
DEVICE RAD COMP TR BAND LRG (VASCULAR PRODUCTS) ×2 IMPLANT
ELECT DEFIB PAD ADLT CADENCE (PAD) ×2 IMPLANT
GLIDESHEATH SLEND SS 6F .021 (SHEATH) ×2 IMPLANT
KIT HEART LEFT (KITS) ×3 IMPLANT
PACK CARDIAC CATHETERIZATION (CUSTOM PROCEDURE TRAY) ×3 IMPLANT
STENT SYNERGY DES 4X32 (Permanent Stent) ×2 IMPLANT
TRANSDUCER W/STOPCOCK (MISCELLANEOUS) ×3 IMPLANT
TUBING CIL FLEX 10 FLL-RA (TUBING) ×3 IMPLANT
WIRE RUNTHROUGH .014X180CM (WIRE) ×2 IMPLANT
WIRE SAFE-T 1.5MM-J .035X260CM (WIRE) ×4 IMPLANT

## 2016-06-18 NOTE — Progress Notes (Signed)
Dr Antoine Poche made aware of pt's continued chest pressure 5/10 despite Morphine SO4 and increasing pain on inspiration.  O2 sats 96% on 2l [place].Pt does not appear in any distress but is "just Miserable".  Bilat BS clear, diminished in bilateral bases.  Will continue to support and monitor patient.

## 2016-06-18 NOTE — Progress Notes (Addendum)
SUBJECTIVE:  Complained of CP throughout the night but improved this am but still present  OBJECTIVE:   Vitals:   Vitals:   06/18/16 0600 06/18/16 0640 06/18/16 0750 06/18/16 0840  BP:  113/70  95/75  Pulse:  76 79   Resp: 18 20  (!) 21  Temp:    97.9 F (36.6 C)  TempSrc:    Oral  SpO2: 95% 96%  93%  Weight:      Height:       I&O's:   Intake/Output Summary (Last 24 hours) at 06/18/16 2706 Last data filed at 06/18/16 0800  Gross per 24 hour  Intake           802.18 ml  Output              754 ml  Net            48.18 ml   TELEMETRY: Reviewed telemetry pt in NSR:     PHYSICAL EXAM General: Well developed, well nourished, in no acute distress Head: Eyes PERRLA, No xanthomas.   Normal cephalic and atramatic  Lungs:   Clear bilaterally to auscultation and percussion. Heart:   HRRR S1 S2 Pulses are 2+ & equal.            No carotid bruit. No JVD.  No abdominal bruits. No femoral bruits. Abdomen: Bowel sounds are positive, abdomen soft and non-tender without masses Msk:  Back normal, normal gait. Normal strength and tone for age. Extremities:   No clubbing, cyanosis or edema.  DP +1 Neuro: Alert and oriented X 3. Psych:  Good affect, responds appropriately   LABS: Basic Metabolic Panel:  Recent Labs  23/76/28 0308  NA 135  K 3.6  CL 101  CO2 25  GLUCOSE 269*  BUN 16  CREATININE 1.06  CALCIUM 8.8*   Liver Function Tests: No results for input(s): AST, ALT, ALKPHOS, BILITOT, PROT, ALBUMIN in the last 72 hours. No results for input(s): LIPASE, AMYLASE in the last 72 hours. CBC:  Recent Labs  06/17/16 0308 06/18/16 0237  WBC 12.9* 12.6*  HGB 13.1 12.4*  HCT 39.9 37.1*  MCV 90.9 90.5  PLT 161 178   Cardiac Enzymes:  Recent Labs  06/17/16 0917 06/17/16 1431 06/17/16 2020  TROPONINI 15.34* 14.23* 15.06*   BNP: Invalid input(s): POCBNP D-Dimer: No results for input(s): DDIMER in the last 72 hours. Hemoglobin A1C: No results for input(s):  HGBA1C in the last 72 hours. Fasting Lipid Panel: No results for input(s): CHOL, HDL, LDLCALC, TRIG, CHOLHDL, LDLDIRECT in the last 72 hours. Thyroid Function Tests: No results for input(s): TSH, T4TOTAL, T3FREE, THYROIDAB in the last 72 hours.  Invalid input(s): FREET3 Anemia Panel: No results for input(s): VITAMINB12, FOLATE, FERRITIN, TIBC, IRON, RETICCTPCT in the last 72 hours. Coag Panel:   No results found for: INR, PROTIME  RADIOLOGY: Dg Chest Portable 1 View  Result Date: 06/14/2016 CLINICAL DATA:  Chest pain beginning this morning.  Sweating. EXAM: PORTABLE CHEST 1 VIEW COMPARISON:  None. FINDINGS: Lungs are clear. Lung volumes are somewhat low. Heart size is normal. No pneumothorax or pleural effusion. IMPRESSION: No acute disease. Electronically Signed   By: Drusilla Kanner M.D.   On: 06/14/2016 11:43   ASSESSMENT/PLAN: 1. Inferior STEMI, peak trop > 65.   --large clot burden so treated with tirofiban post cath x 18 hours --s/p DES to RCA 7/28 --echo with severe LV dysfunction with EF 25-30% with AK of the basal and mid inferior  wall and HK of the inferolateral and inferoseptal walls as well as apical inferior wall and moderately reduced RVF.   --He had recurrent CP yesterday intermittent throughout the day similar to his pain with his MI but also somewhat atypical in that it is also worse with deep breathing.  Transferred back to 2H and placed on Heparin.  Pain became more intense last night.  Trop mildly increased to 15.06 yesterday evening (14.23>>15.06).  Pain improved this am but still present.  He has had persistence of his ST elevation in the inferolateral leads.  Recommend relook cath today to assess for patency of RCA stent.  Would expect a larger bump in trop if he had closed with stent down.  Most likely has some pericardial inflammation post MI although somewhat early to see this.  Will make NPO for cath this afternoon (ate breakfast). BNP mildly elevated and got a  dose of Lasix yesterday. Will add Lasix  PO daily and check LVEDP at time of cath to see if dose needs to be increased but we are limited in aggressive diuresis by soft BP.  If cath shows patent RCA stent, then will repeat echo to look for pericardia effusion.  --continue ASA/Brilinta/BB.  Decrease Coreg to 3.125mg  BID due to soft BP. 2. CAD --cath with RCA 100%, LAD 10%, LCX ok --continue ASA/Brilinta, statin, b-blocker.   3. Morbid obesity 4. DVT - apparently coag workup was normal  --about 2 years ago after long plane ride (26 hours). Treated with Eliquis x 6 months. Now off.  5. DM2,  --this is new diagnosis for him. CBGs very high. HgbA1c 10.6. Start SSI. Started metformin yesterday. DM2 teaching consult. Will need weight loss and increased activity. Needs early followup with PCP. 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  He will need a lifevest for 2 months and repeat 2D echo to reassess LVF post revascularization.  Decrease Coreg to 3.125mg  BID and and Lisinopril to 2.5mg  daily due to soft BP.   Armanda Magic, MD  06/18/2016  8:52 AM

## 2016-06-18 NOTE — Progress Notes (Signed)
Instructed to avoid moving right arm  To avoid bleeding, but non- compliant at times by using right arm to do texting. Continue to monitor.

## 2016-06-18 NOTE — Progress Notes (Signed)
ANTICOAGULATION CONSULT NOTE - Follow-up Consult  Pharmacy Consult for heparin Indication: chest pain/ACS  No Known Allergies  Patient Measurements: Height: 5\' 10"  (177.8 cm) Weight: 279 lb (126.6 kg) IBW/kg (Calculated) : 73 Heparin Dosing Weight: 101.8 kg  Vital Signs: Temp: 98.5 F (36.9 C) (08/01 0000) Temp Source: Oral (08/01 0000) BP: 103/70 (07/31 2340) Pulse Rate: 73 (08/01 0000)  Labs:  Recent Labs  06/17/16 0308 06/17/16 0917 06/17/16 1431 06/17/16 2020 06/18/16 0237  HGB 13.1  --   --   --  12.4*  HCT 39.9  --   --   --  37.1*  PLT 161  --   --   --  178  HEPARINUNFRC  --   --   --   --  0.25*  CREATININE 1.06  --   --   --   --   TROPONINI  --  15.34* 14.23* 15.06*  --     Estimated Creatinine Clearance: 99 mL/min (by C-G formula based on SCr of 1.06 mg/dL).  Assessment: 60 yo male s/p STEMI with DES to RCA 7/28. Pt now with recurrent CP. Trop with slight upward trend. Restarted heparin for r/o ACS. Heparin level subtherapeutic on 1300 units/hr. Hgb down a bit. No issues with line or bleeding reported per RN.  Goal of Therapy:  Heparin level 0.3-0.7 units/ml Monitor platelets by anticoagulation protocol: Yes   Plan:  - Increase heparin to 1500 units/hr. - F/u 6hr heparin level   Christoper Fabian, PharmD, BCPS Clinical pharmacist, pager [phone] 06/18/2016,3:53 AM

## 2016-06-18 NOTE — Progress Notes (Signed)
Inpatient Diabetes Program Recommendations  AACE/ADA: New Consensus Statement on Inpatient Glycemic Control (2015)  Target Ranges:  Prepandial:   less than 140 mg/dL      Peak postprandial:   less than 180 mg/dL (1-2 hours)      Critically ill patients:  140 - 180 mg/dL   Lab Results  Component Value Date   GLUCAP 252 (H) 06/18/2016   HGBA1C 10.6 (H) 06/14/2016    Review of Glycemic Control: Results for Tyler Harper, Tyler Harper (MRN [id number]) as of 06/18/2016 12:27  Ref. Range 06/17/2016 08:26 06/17/2016 11:32 06/17/2016 16:30 06/17/2016 22:18 06/18/2016 08:40  Glucose-Capillary Latest Ref Range: 65 - 99 mg/dL 301 (H) 601 (H) 093 (H) 285 (H) 252 (H)   Inpatient Diabetes Program Recommendations:   Consider increasing Lantus to 30 units daily and add Novolog meal coverage 5 units tid with meals.   Thanks, Beryl Meager, RN, BC-ADM Inpatient Diabetes Coordinator Pager [phone] (8a-5p)

## 2016-06-18 NOTE — Plan of Care (Signed)
Problem: Food- and Nutrition-Related Knowledge Deficit (NB-1.1) Goal: Nutrition education Formal process to instruct or train a patient/client in a skill or to impart knowledge to help patients/clients voluntarily manage or modify food choices and eating behavior to maintain or improve health. Outcome: Completed/Met Date Met: 06/18/16  RD consulted for nutrition education regarding diabetes.   Lab Results  Component Value Date   HGBA1C 10.6 (H) 06/14/2016   Spoke with pt and spouse at bedside. They are very motivated to make changes to control DM and heart disease. Both are very eager to learn, took notes, and asked very insightful questions. They reveal that they plan to attend cardiac rehab after discharge and meet with CDE to build upon knowledge.  RD provided "Carbohydrate Counting for People with Diabetes" handout from the Academy of Nutrition and Dietetics. Discussed different food groups and their effects on blood sugar, emphasizing carbohydrate-containing foods. Provided list of carbohydrates and recommended serving sizes of common foods.  Discussed importance of controlled and consistent carbohydrate intake throughout the day. Provided examples of ways to balance meals/snacks and encouraged intake of high-fiber, whole grain complex carbohydrates. Teach back method used.  Expect very good compliance.  Body mass index is 40.27 kg/m. Pt meets criteria for extreme obesity, class III based on current BMI.  Current diet order is carb modified, patient is consuming approximately 100% of meals at this time. Labs and medications reviewed. No further nutrition interventions warranted at this time. RD contact information provided. If additional nutrition issues arise, please re-consult RD.  Tyler Harper, RD, LDN, CDE Pager: [phone] After hours Pager: [phone]

## 2016-06-18 NOTE — Progress Notes (Signed)
[employer] rep working on [employer] for pt. Cm will cont to follow.

## 2016-06-18 NOTE — H&P (View-Only) (Signed)
SUBJECTIVE:  Complained of CP throughout the night but improved this am but still present  OBJECTIVE:   Vitals:   Vitals:   06/18/16 0600 06/18/16 0640 06/18/16 0750 06/18/16 0840  BP:  113/70  95/75  Pulse:  76 79   Resp: 18 20  (!) 21  Temp:    97.9 F (36.6 C)  TempSrc:    Oral  SpO2: 95% 96%  93%  Weight:      Height:       I&O's:   Intake/Output Summary (Last 24 hours) at 06/18/16 2706 Last data filed at 06/18/16 0800  Gross per 24 hour  Intake           802.18 ml  Output              754 ml  Net            48.18 ml   TELEMETRY: Reviewed telemetry pt in NSR:     PHYSICAL EXAM General: Well developed, well nourished, in no acute distress Head: Eyes PERRLA, No xanthomas.   Normal cephalic and atramatic  Lungs:   Clear bilaterally to auscultation and percussion. Heart:   HRRR S1 S2 Pulses are 2+ & equal.            No carotid bruit. No JVD.  No abdominal bruits. No femoral bruits. Abdomen: Bowel sounds are positive, abdomen soft and non-tender without masses Msk:  Back normal, normal gait. Normal strength and tone for age. Extremities:   No clubbing, cyanosis or edema.  DP +1 Neuro: Alert and oriented X 3. Psych:  Good affect, responds appropriately   LABS: Basic Metabolic Panel:  Recent Labs  23/76/28 0308  NA 135  K 3.6  CL 101  CO2 25  GLUCOSE 269*  BUN 16  CREATININE 1.06  CALCIUM 8.8*   Liver Function Tests: No results for input(s): AST, ALT, ALKPHOS, BILITOT, PROT, ALBUMIN in the last 72 hours. No results for input(s): LIPASE, AMYLASE in the last 72 hours. CBC:  Recent Labs  06/17/16 0308 06/18/16 0237  WBC 12.9* 12.6*  HGB 13.1 12.4*  HCT 39.9 37.1*  MCV 90.9 90.5  PLT 161 178   Cardiac Enzymes:  Recent Labs  06/17/16 0917 06/17/16 1431 06/17/16 2020  TROPONINI 15.34* 14.23* 15.06*   BNP: Invalid input(s): POCBNP D-Dimer: No results for input(s): DDIMER in the last 72 hours. Hemoglobin A1C: No results for input(s):  HGBA1C in the last 72 hours. Fasting Lipid Panel: No results for input(s): CHOL, HDL, LDLCALC, TRIG, CHOLHDL, LDLDIRECT in the last 72 hours. Thyroid Function Tests: No results for input(s): TSH, T4TOTAL, T3FREE, THYROIDAB in the last 72 hours.  Invalid input(s): FREET3 Anemia Panel: No results for input(s): VITAMINB12, FOLATE, FERRITIN, TIBC, IRON, RETICCTPCT in the last 72 hours. Coag Panel:   No results found for: INR, PROTIME  RADIOLOGY: Dg Chest Portable 1 View  Result Date: 06/14/2016 CLINICAL DATA:  Chest pain beginning this morning.  Sweating. EXAM: PORTABLE CHEST 1 VIEW COMPARISON:  None. FINDINGS: Lungs are clear. Lung volumes are somewhat low. Heart size is normal. No pneumothorax or pleural effusion. IMPRESSION: No acute disease. Electronically Signed   By: Drusilla Kanner M.D.   On: 06/14/2016 11:43   ASSESSMENT/PLAN: 1. Inferior STEMI, peak trop > 65.   --large clot burden so treated with tirofiban post cath x 18 hours --s/p DES to RCA 7/28 --echo with severe LV dysfunction with EF 25-30% with AK of the basal and mid inferior  wall and HK of the inferolateral and inferoseptal walls as well as apical inferior wall and moderately reduced RVF.   --He had recurrent CP yesterday intermittent throughout the day similar to his pain with his MI but also somewhat atypical in that it is also worse with deep breathing.  Transferred back to 2H and placed on Heparin.  Pain became more intense last night.  Trop mildly increased to 15.06 yesterday evening (14.23>>15.06).  Pain improved this am but still present.  He has had persistence of his ST elevation in the inferolateral leads.  Recommend relook cath today to assess for patency of RCA stent.  Would expect a larger bump in trop if he had closed with stent down.  Most likely has some pericardial inflammation post MI although somewhat early to see this.  Will make NPO for cath this afternoon (ate breakfast). BNP mildly elevated and got a  dose of Lasix yesterday. Will add Lasix 20mg PO daily and check LVEDP at time of cath to see if dose needs to be increased but we are limited in aggressive diuresis by soft BP.  If cath shows patent RCA stent, then will repeat echo to look for pericardia effusion.  --continue ASA/Brilinta/BB.  Decrease Coreg to 3.125mg BID due to soft BP. 2. CAD --cath with RCA 100%, LAD 10%, LCX ok --continue ASA/Brilinta, statin, b-blocker.   3. Morbid obesity 4. DVT - apparently coag workup was normal  --about 2 years ago after long plane ride (26 hours). Treated with Eliquis x 6 months. Now off.  5. DM2,  --this is new diagnosis for him. CBGs very high. HgbA1c 10.6. Start SSI. Started metformin yesterday. DM2 teaching consult. Will need weight loss and increased activity. Needs early followup with PCP. 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  He will need a lifevest for 2 months and repeat 2D echo to reassess LVF post revascularization.  Decrease Coreg to 3.125mg BID and and Lisinopril to 2.5mg daily due to soft BP.   Traci Turner, MD  06/18/2016  8:52 AM 

## 2016-06-18 NOTE — Interval H&P Note (Signed)
History and Physical Interval Note:  06/18/2016 1:15 PM  Tyler Harper  has presented today for cardiac cath with the diagnosis of cp  The various methods of treatment have been discussed with the patient and family. After consideration of risks, benefits and other options for treatment, the patient has consented to  Procedure(s): Left Heart Cath and Coronary Angiography (N/A) as a surgical intervention .  The patient's history has been reviewed, patient examined, no change in status, stable for surgery.  I have reviewed the patient's chart and labs.  Questions were answered to the patient's satisfaction.    Cath Lab Visit (complete for each Cath Lab visit)  Clinical Evaluation Leading to the Procedure:   ACS: Yes.    Non-ACS:    Anginal Classification: CCS IV  Anti-ischemic medical therapy: Minimal Therapy (1 class of medications)  Non-Invasive Test Results: No non-invasive testing performed  Prior CABG: No previous CABG        Verne Carrow

## 2016-06-19 DIAGNOSIS — I420 Dilated cardiomyopathy: Secondary | ICD-10-CM | POA: Diagnosis not present

## 2016-06-19 DIAGNOSIS — E1165 Type 2 diabetes mellitus with hyperglycemia: Secondary | ICD-10-CM | POA: Diagnosis not present

## 2016-06-19 DIAGNOSIS — Z6841 Body Mass Index (BMI) 40.0 and over, adult: Secondary | ICD-10-CM | POA: Diagnosis not present

## 2016-06-19 DIAGNOSIS — I2119 ST elevation (STEMI) myocardial infarction involving other coronary artery of inferior wall: Secondary | ICD-10-CM | POA: Diagnosis not present

## 2016-06-19 LAB — CBC
HCT: 35 % — ABNORMAL LOW (ref 39.0–52.0)
HEMOGLOBIN: 11.6 g/dL — AB (ref 13.0–17.0)
MCH: 29.7 pg (ref 26.0–34.0)
MCHC: 33.1 g/dL (ref 30.0–36.0)
MCV: 89.7 fL (ref 78.0–100.0)
PLATELETS: 210 10*3/uL (ref 150–400)
RBC: 3.9 MIL/uL — AB (ref 4.22–5.81)
RDW: 13.7 % (ref 11.5–15.5)
WBC: 11 10*3/uL — AB (ref 4.0–10.5)

## 2016-06-19 LAB — BASIC METABOLIC PANEL
ANION GAP: 11 (ref 5–15)
BUN: 29 mg/dL — ABNORMAL HIGH (ref 6–20)
CHLORIDE: 100 mmol/L — AB (ref 101–111)
CO2: 22 mmol/L (ref 22–32)
Calcium: 8.5 mg/dL — ABNORMAL LOW (ref 8.9–10.3)
Creatinine, Ser: 1.17 mg/dL (ref 0.61–1.24)
GFR calc Af Amer: >60 mL/min (ref >60)
GLUCOSE: 180 mg/dL — AB (ref 65–99)
POTASSIUM: 3.3 mmol/L — AB (ref 3.5–5.1)
SODIUM: 133 mmol/L — AB (ref 135–145)

## 2016-06-19 LAB — GLUCOSE, CAPILLARY
GLUCOSE-CAPILLARY: 184 mg/dL — AB (ref 65–99)
GLUCOSE-CAPILLARY: 177 mg/dL — AB (ref 65–99)
Glucose-Capillary: 241 mg/dL — ABNORMAL HIGH (ref 65–99)
Glucose-Capillary: 211 mg/dL — ABNORMAL HIGH (ref 65–99)

## 2016-06-19 MED ORDER — POTASSIUM CHLORIDE CRYS ER 20 MEQ PO TBCR
40.0000 meq | EXTENDED_RELEASE_TABLET | Freq: Two times a day (BID) | ORAL | Status: AC
  Administered 2016-06-19 (×2): 40 meq via ORAL
  Filled 2016-06-19 (×2): qty 2

## 2016-06-19 MED ORDER — POTASSIUM CHLORIDE CRYS ER 20 MEQ PO TBCR
20.0000 meq | EXTENDED_RELEASE_TABLET | Freq: Every day | ORAL | Status: DC
  Administered 2016-06-20: 20 meq via ORAL
  Filled 2016-06-19: qty 1

## 2016-06-19 MED ORDER — PNEUMOCOCCAL VAC POLYVALENT 25 MCG/0.5ML IJ INJ
0.5000 mL | INJECTION | INTRAMUSCULAR | Status: AC
  Administered 2016-06-20: 0.5 mL via INTRAMUSCULAR
  Filled 2016-06-19: qty 0.5

## 2016-06-19 MED ORDER — SPIRONOLACTONE 25 MG PO TABS
25.0000 mg | ORAL_TABLET | Freq: Every day | ORAL | Status: DC
  Administered 2016-06-19 – 2016-06-20 (×2): 25 mg via ORAL
  Filled 2016-06-19 (×2): qty 1

## 2016-06-19 MED ORDER — TICAGRELOR 90 MG PO TABS
90.0000 mg | ORAL_TABLET | Freq: Two times a day (BID) | ORAL | Status: DC
  Administered 2016-06-19 – 2016-06-20 (×3): 90 mg via ORAL
  Filled 2016-06-19 (×3): qty 1

## 2016-06-19 NOTE — Progress Notes (Signed)
CARDIAC REHAB PHASE I   PRE:  Rate/Rhythm: 89 SR  BP:  Sitting: 114/63        SaO2: 100 RA  MODE:  Ambulation: 550 ft   POST:  Rate/Rhythm: 94 SR  BP:  Sitting: 120/59         SaO2: 99 RA  Pt ambulated 550 ft on RA, independent, steady gait, tolerated well with no complaints other than mild DOE, much improved since his walk on Monday. Pt unaware of brilinta co-pay, case manager to follow-up with pt. Pt to chair after walk, call bell within reach. Will follow.   [phone] Joylene Grapes, RN, BSN 06/19/2016 3:36 PM

## 2016-06-19 NOTE — Progress Notes (Addendum)
SUBJECTIVE:  Chest pain significantly improved.  Wants to go home  OBJECTIVE:   Vitals:   Vitals:   06/19/16 0400 06/19/16 0455 06/19/16 0736 06/19/16 0950  BP: 96/73  130/85 107/68  Pulse:      Resp: 19  (!) 22   Temp: 98.3 F (36.8 C)  97.4 F (36.3 C)   TempSrc: Oral  Oral   SpO2: 93%  97%   Weight:  281 lb (127.5 kg)    Height:       I&O's:   Intake/Output Summary (Last 24 hours) at 06/19/16 1027 Last data filed at 06/19/16 0951  Gross per 24 hour  Intake           1382.3 ml  Output                0 ml  Net           1382.3 ml   TELEMETRY: Reviewed telemetry pt in NSR:     PHYSICAL EXAM General: Well developed, well nourished, in no acute distress Head: Eyes PERRLA, No xanthomas.   Normal cephalic and atramatic  Lungs:   Clear bilaterally to auscultation and percussion. Heart:   HRRR S1 S2 Pulses are 2+ & equal. Abdomen: Bowel sounds are positive, abdomen soft and non-tender without masses Msk:  Back normal, normal gait. Normal strength and tone for age. Extremities:   No clubbing, cyanosis or edema.  DP +1 Neuro: Alert and oriented X 3. Psych:  Good affect, responds appropriately   LABS: Basic Metabolic Panel:  Recent Labs  45/03/88 0308 06/19/16 0235  NA 135 133*  K 3.6 3.3*  CL 101 100*  CO2 25 22  GLUCOSE 269* 180*  BUN 16 29*  CREATININE 1.06 1.17  CALCIUM 8.8* 8.5*   Liver Function Tests: No results for input(s): AST, ALT, ALKPHOS, BILITOT, PROT, ALBUMIN in the last 72 hours. No results for input(s): LIPASE, AMYLASE in the last 72 hours. CBC:  Recent Labs  06/18/16 0237 06/19/16 0235  WBC 12.6* 11.0*  HGB 12.4* 11.6*  HCT 37.1* 35.0*  MCV 90.5 89.7  PLT 178 210   Cardiac Enzymes:  Recent Labs  06/17/16 0917 06/17/16 1431 06/17/16 2020  TROPONINI 15.34* 14.23* 15.06*   BNP: Invalid input(s): POCBNP D-Dimer: No results for input(s): DDIMER in the last 72 hours. Hemoglobin A1C: No results for input(s): HGBA1C in the  last 72 hours. Fasting Lipid Panel: No results for input(s): CHOL, HDL, LDLCALC, TRIG, CHOLHDL, LDLDIRECT in the last 72 hours. Thyroid Function Tests: No results for input(s): TSH, T4TOTAL, T3FREE, THYROIDAB in the last 72 hours.  Invalid input(s): FREET3 Anemia Panel: No results for input(s): VITAMINB12, FOLATE, FERRITIN, TIBC, IRON, RETICCTPCT in the last 72 hours. Coag Panel:   Lab Results  Component Value Date   INR 1.09 06/18/2016    RADIOLOGY: Dg Chest Portable 1 View  Result Date: 06/14/2016 CLINICAL DATA:  Chest pain beginning this morning.  Sweating. EXAM: PORTABLE CHEST 1 VIEW COMPARISON:  None. FINDINGS: Lungs are clear. Lung volumes are somewhat low. Heart size is normal. No pneumothorax or pleural effusion. IMPRESSION: No acute disease. Electronically Signed   By: Drusilla Kanner M.D.   On: 06/14/2016 11:43   ASSESSMENT/PLAN: 1. Inferior STEMI, peak trop >65.   --large clot burden so treated with tirofiban post cath x 18 hours --s/p DES to RCA 7/28 --echo with severe LV dysfunction with EF 25-30% with AK of the basal and mid inferior wall and HK of  the inferolateral and inferoseptal walls as well as apical inferior wall and moderately reduced RVF.  --He had recurrent CP yesterday intermittent throughout the day similar to his pain with his MI but also somewhat atypical in that it is also worse with deep breathing.  Transferred back to 2H and placed on Heparin.  Pain became more intense yesterday.  Trop mildly increased to 15.06 yesterday evening (14.23>>15.06). Repeat cath showed sub-acute occlusion of the mid RCA due to thrombus s/p PTCA and DES in heavily thrombotic prox RCA. There was no flow into the mid or distal vessel with "no re-flow" phenomenon with massive amount of thombus in distal vascular bed.  He will remain on ASA/Brilinta for 1 year.  Continue BB/statin.  Per Dr. Gibson Ramp note, no further plans for relook cath and no further mechanical improvement to  be offered for this vessel.  His chest pain is significantly improved.   2. CAD --cath with RCA 100%, LAD 10%, LCX ok.  Repeat cath as above with occluded mid RCA s/p repeat stenting in prox RCA with no reflow in mid and distal RCA - med management. --continue ASA/Brilinta, statin, b-blocker.  3. Morbid obesity 4. DVT - apparently coag workup was normal  --about 2 years ago after long plane ride (26 hours). Treated with Eliquis x 6 months. Now off.  5. DM2,  --this is new diagnosis for him. CBGs very high. HgbA1c 10.6. Continue SSI. DM2 teaching consult. Will need weight loss and increased activity. Needs early followup with PCP.  Restart Metformin on 8/4. 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 He will need a lifevest for 2 months and repeat 2D echo to reassess LVF post revascularization. Contineu Coreg to 3.125mg  BID and Lisinopril to 2.5mg  daily.  Cannot titrate due to soft BP. LVEDP was elevated at cath at .  Continue Lasix  daily and add aldactone  daily.  7.  Hypokalemia - replete  Patient doing well.  I think we should transfer him to tele bed and get up ambulating to make sure he has no further CP and plan for discharge home tomorrow.    Armanda Magic, MD  06/19/2016  10:27 AM

## 2016-06-19 NOTE — Progress Notes (Addendum)
Inpatient Diabetes Program Recommendations  AACE/ADA: New Consensus Statement on Inpatient Glycemic Control (2015)  Target Ranges:  Prepandial:   less than 140 mg/dL      Peak postprandial:   less than 180 mg/dL (1-2 hours)      Critically ill patients:  140 - 180 mg/dL   Results for Tyler Harper, Tyler Harper (MRN [id number]) as of 06/19/2016 14:34  Ref. Range 06/18/2016 08:40 06/18/2016 11:48 06/18/2016 17:04 06/18/2016 21:25  Glucose-Capillary Latest Ref Range: 65 - 99 mg/dL 270 (H) 350 (H) 093 (H) 196 (H)   Results for [name], [name] (MRN [id number]) as of 06/19/2016 14:34  Ref. Range 06/19/2016 07:34 06/19/2016 11:52  Glucose-Capillary Latest Ref Range: 65 - 99 mg/dL 696 (H) 789 (H)    Current Insulin Orders: Lantus 25 units daily      Novolog Moderate Correction Scale/ SSI (0-15 units) TID AC     -Spoke with patient and his wife today to answer any further questions they may have about diabetes (patient with new onset DM this admit).  Discussed Lantus and Novolog (how to take, when to take) and Metformin as well.  Also discussed blood sugar goals at home and encouraged patient to check his CBGs at least tid at home.  Encouraged patient to follow up with PCP and may want to establish care under Endocrinologist at some point in the future.  Patient stated he feels comfortable with insulin injections.    -Patient has orders for Outpatient DM education at the [facility] Nutrition and DM management center after d/c.    MD- Please consider the following in-hospital insulin adjustments:  1. Increase Lantus to 32 units daily (0.25 units/kg dosing)  2. Start Novolog Meal Coverage: Novolog 4 units tid with meals (hold if pt eats <50% of meal)    --Will follow patient during hospitalization--  Ambrose Finland RN, MSN, CDE Diabetes Coordinator Inpatient Glycemic Control Team Team Pager: [phone] (8a-5p)

## 2016-06-19 NOTE — Progress Notes (Signed)
Pt is starting to c/o cold like symptoms, itchy throat, and cough. RN offered to call md and receive orders, pt decline at this time. Cecille Rubin, RN

## 2016-06-19 NOTE — Care Management Note (Addendum)
Case Management Note  Patient Details  Name: Tyler Harper MRN: [id number] Date of Birth: 1956/04/07  Subjective/Objective:    Inferior Stemi- plan for home on Brilinta. Pt has 30 day free card and co pay card. Pt is aware that he has not met deductible. CM did speak with him in regards to cost. Pt has life vest in the room via [place].                 Action/Plan: [facility] at [facility] location:  [address] [place], [place] [address]  Kary Kos is available. No further needs from CM at this time.    Expected Discharge Date:                  Expected Discharge Plan:  Home/Self Care  In-House Referral:  NA  Discharge planning Services  CM Consult, Medication Assistance  Post Acute Care Choice:  Durable Medical Equipment Choice offered to:  Patient  DME Arranged:  Vest life vest DME Agency:  Other - Comment ([employer])  HH Arranged:  NA HH Agency:  NA  Status of Service:  Completed, signed off  If discussed at [place] of Stay Meetings, dates discussed:    Additional Comments:  S/W PETE @ [employer] # [phone]   GLARGINE ( LANTUS )  ** NOT COVER *  ALTERNATIVE:  LEVEMIR 25 UNITS AND BASAGLAR 25 UNITS  COVER- YES  CO-PAY - $35.00  TIER- 2 DRUG  PRIOR APPROVAL - NO   NOVOLOG INJECTION 0-15 UNITS  COVER- YES  CO-PAY- $100.00  TIER- 4 DRUG  PRIOR APPROVAL -NO   Tyler Harper, Tyler Cornfield, RN 06/19/2016, 3:42 PM

## 2016-06-20 ENCOUNTER — Encounter: Payer: Self-pay | Admitting: Cardiology

## 2016-06-20 ENCOUNTER — Other Ambulatory Visit: Payer: Self-pay | Admitting: Cardiology

## 2016-06-20 DIAGNOSIS — I2119 ST elevation (STEMI) myocardial infarction involving other coronary artery of inferior wall: Secondary | ICD-10-CM | POA: Diagnosis not present

## 2016-06-20 LAB — BASIC METABOLIC PANEL
Anion gap: 9 (ref 5–15)
BUN: 22 mg/dL — AB (ref 6–20)
CHLORIDE: 99 mmol/L — AB (ref 101–111)
CO2: 25 mmol/L (ref 22–32)
CREATININE: 1.23 mg/dL (ref 0.61–1.24)
Calcium: 8.6 mg/dL — ABNORMAL LOW (ref 8.9–10.3)
GFR calc Af Amer: >60 mL/min (ref >60)
GFR calc non Af Amer: >60 mL/min (ref >60)
GLUCOSE: 245 mg/dL — AB (ref 65–99)
Potassium: 3.7 mmol/L (ref 3.5–5.1)
Sodium: 133 mmol/L — ABNORMAL LOW (ref 135–145)

## 2016-06-20 LAB — CBC
HCT: 35.1 % — ABNORMAL LOW (ref 39.0–52.0)
Hemoglobin: 11.6 g/dL — ABNORMAL LOW (ref 13.0–17.0)
MCH: 29.7 pg (ref 26.0–34.0)
MCHC: 33 g/dL (ref 30.0–36.0)
MCV: 89.8 fL (ref 78.0–100.0)
PLATELETS: 199 10*3/uL (ref 150–400)
RBC: 3.91 MIL/uL — AB (ref 4.22–5.81)
RDW: 13.7 % (ref 11.5–15.5)
WBC: 8.8 10*3/uL (ref 4.0–10.5)

## 2016-06-20 LAB — GLUCOSE, CAPILLARY
GLUCOSE-CAPILLARY: 170 mg/dL — AB (ref 65–99)
Glucose-Capillary: 249 mg/dL — ABNORMAL HIGH (ref 65–99)

## 2016-06-20 MED ORDER — NITROGLYCERIN 0.4 MG SL SUBL: 0.4000 mg | SUBLINGUAL_TABLET | SUBLINGUAL | 3 refills | Status: AC | PRN

## 2016-06-20 MED ORDER — FUROSEMIDE 20 MG PO TABS: 20.0000 mg | ORAL_TABLET | Freq: Every day | ORAL | 11 refills | Status: AC

## 2016-06-20 MED ORDER — LISINOPRIL 2.5 MG PO TABS: 2.5000 mg | ORAL_TABLET | Freq: Every day | ORAL | 11 refills | Status: DC

## 2016-06-20 MED ORDER — POTASSIUM CHLORIDE CRYS ER 20 MEQ PO TBCR: 20.0000 meq | EXTENDED_RELEASE_TABLET | Freq: Every day | ORAL | 11 refills | Status: AC

## 2016-06-20 MED ORDER — INSULIN DETEMIR 100 UNIT/ML FLEXPEN: 32.0000 [IU] | Freq: Every day | SUBCUTANEOUS | Status: DC

## 2016-06-20 MED ORDER — TICAGRELOR 90 MG PO TABS: 90.0000 mg | ORAL_TABLET | Freq: Two times a day (BID) | ORAL | 10 refills | Status: AC

## 2016-06-20 MED ORDER — SPIRONOLACTONE 25 MG PO TABS: 25.0000 mg | ORAL_TABLET | Freq: Every day | ORAL | 11 refills | Status: AC

## 2016-06-20 MED ORDER — ATORVASTATIN CALCIUM 80 MG PO TABS: 80.0000 mg | ORAL_TABLET | Freq: Every day | ORAL | 11 refills | Status: AC

## 2016-06-20 MED ORDER — CARVEDILOL 3.125 MG PO TABS: 3.1250 mg | ORAL_TABLET | Freq: Two times a day (BID) | ORAL | 11 refills | Status: AC

## 2016-06-20 MED ORDER — METFORMIN HCL 500 MG PO TABS: 500.0000 mg | ORAL_TABLET | Freq: Two times a day (BID) | ORAL | 11 refills | Status: AC

## 2016-06-20 MED ORDER — INSULIN ASPART 100 UNIT/ML ~~LOC~~ SOLN
4.0000 [IU] | Freq: Three times a day (TID) | SUBCUTANEOUS | Status: DC
  Administered 2016-06-20: 4 [IU] via SUBCUTANEOUS

## 2016-06-20 MED ORDER — INSULIN PEN NEEDLE 31G X 5 MM MISC: 1.0000 "pen " | Freq: Once | 3 refills | Status: AC

## 2016-06-20 MED ORDER — INSULIN DETEMIR 100 UNIT/ML ~~LOC~~ SOLN: 32.0000 [IU] | Freq: Every day | SUBCUTANEOUS | 11 refills | Status: DC

## 2016-06-20 MED ORDER — BLOOD GLUCOSE MONITOR KIT: PACK | 0 refills | Status: AC

## 2016-06-20 NOTE — Discharge Summary (Signed)
Discharge Summary    Patient ID: Tyler Harper,  MRN: [id number], DOB/AGE: [birth year] 60 y.o.  Admit date: 06/14/2016 Discharge date: 06/20/2016  Primary Care Provider: Jolyne Loa Primary Cardiologist: Dr. Irish Lack  Discharge Diagnoses    Principal Problem:   Acute MI, inferior wall, initial episode of care [facility]) Active Problems:   Diabetes mellitus type 2, uncontrolled ([place])   Congestive cardiomyopathy ([place])   Hyperlipidemia LDL goal <70   DVT, lower extremity ([place])   Chest pain   Coronary artery disease involving native coronary artery of native heart with unstable angina pectoris ([place])   Allergies No Known Allergies  Diagnostic Studies/Procedures    06/14/2016 LHC  Conclusion     A STENT SYNERGY DES [id number] drug eluting stent was successfully placed across the thrombotic occlusion in the RCA.  Prox RCA to Mid RCA lesion, 100 %stenosed was the culprit lesion.  Post intervention with aspiration thrombectomy, rheolytic thrombectomy and stent placement, there is a 0% residual stenosis.  There is no aortic valve stenosis.  LV end diastolic pressure is moderately elevated.   Heavily thrombotic lesion in the proximal to mid RCA. Successfully stented with a 4.0 x 38 Synergy drug-eluting stent. He will need dual antiplatelet therapy for at least a year. He has a history of prior venous thromboembolism. There may be some type of genetic abnormality which makes him hypercoagulable. Start low-dose beta blocker and high-dose statin per routine. He'll be watched in the ICU. Continue IV tirofiban for 18 hours.   06/16/2016 2D echo  Study Conclusions  - Left ventricle: The cavity size was normal. Septal wall thickness   was increased in a pattern of moderate LVH with mild hypertrophy   of the posterior wall. Systolic function was severely reduced.   The estimated ejection fraction was in the range of 25% to 30%.   Hypokinesis of the inferolateral and inferoseptal  myocardium.   Akinesis of the basal to mid inferior wall and hypokinesis of the   apical inferior wall. Doppler parameters are consistent with   abnormal left ventricular relaxation (grade 1 diastolic   dysfunction). - Aortic valve: Transvalvular velocity was within the normal range.   There was no stenosis. There was no regurgitation. - Mitral valve: There was trivial regurgitation. - Right ventricle: The cavity size was mildly dilated. Wall   thickness was normal. Systolic function was mildly to moderately   reduced. - Right atrium: The atrium was moderately dilated. - Atrial septum: No defect or patent foramen ovale was identified   by color flow Doppler. - Tricuspid valve: There was trivial regurgitation. - Pulmonary arteries: Systolic pressure was within the normal   range. PA peak pressure: 20 mm Hg (S).  06/18/2016  Conclusion     A STENT SYNERGY DES 4X32 drug eluting stent was successfully placed, and overlaps previously placed stent.  Prox RCA to Mid RCA lesion, 100 %stenosed.  Post intervention, there is a 0% residual stenosis.  Mid LAD to Dist LAD lesion, 10 %stenosed.  Mid RCA lesion, 100 %stenosed.  Post intervention, there is a 50% residual stenosis.   1. Sub-acute occlusion of the mid RCA stent secondary to thrombus 60 hours post stent placement 2. PTCA/DES placement in the heavily thrombotic proximal RCA. Unfortunately, there is no flow into the mid or distal vessel. This is likely due to "no re-flow" phenomenon with massive amount of thrombus in the distal vascular bed.  3. Mildly elevated filling pressures  Recommendations: Admit to CCU. Continue  ASA and Brilinta for one year. Continue Aggrastat infusion 24 hours. Continue beta blocker and statin. No plans for re-look cath at this time as there is no further mechanical improvement to be offered for this vessel.    _____________   History of Present Illness     60 yo male with PMH of obesity, remote  tobacco use, and DVT who presented to [place] on 06/14/2016 with reports of substernal chest pain that started that morning around 10am, with associated dyspnea, nausea and diaphoresis. There his EKG was consistent with inferior STEMI, he was given 353m ASA, Heparin bolus, nitro paste, and 1000cc NS bolus. [facility] was notififed and [facility] was [facility] En route he was 440mmorphine, which reduced his chest pain from 5/10 to 3/10. He was taken directly to the cath lab, where his nausea and diaphoresis had improved.   Hospital Course     Consultants: Diabetes Coordinator   He underwent a LHC with Dr. VaIrish Lackith aspiration thrombectomy, rheolytic thrombectomy and DES placed to the prox-mid RCA. His LV end diastolic pressure was moderately elevated. He was started on DAPT (Brilinta and ASA) along with low dose BB, and high dose statin. IV tirofiban was continued for 18 hours post intervention, while he was monitored in the ICU. Post PCI he did continue to report burning sensation in his chest, reporting it "felt like heartburn", was treated with Maalox without relief, then SL nitro x3 with some improvement. Ultimately did resolve with 60m60morphine. Repeat EKG showed borderline worsening in his inferior leads compared to tracing immediately post cath, but improved from initial EKG.  His trop peaked at greater than 65, and began a downward trend.   The following day he did well with ambulation with cardiac rehab. His Hgb A1c was noted at 10.6, and he was started on SSI with a plan to began metformin once appropriate post PCI. The diabetic coordinator was consulted to help with education related to this new diagnosis and instructions regarding insulin injections at home.   His 2D echo showed a decreased EF of 25-30% wth basal and mid inferior AK and HK of the inferolateral/inferoseptal and apical walls. He was fitted for a lifevest with plans to reassess EF in 2 months. His lopressor was  changed to Coreg 3.125m75mD and lisinopril 5mg 59m added. Planned to d/c home this day 06/17/2016. Around 1:30pm Mr. SchilPopoffted c/o dyspnea and chest pressure. Repeat EKG showed persistent ST elevation in inferior leads that was improving, but worsening ST elevation in the lateral leads. Repeat trop actually continued to trend down. [place] given his BNP was 223. He was also started back on IV heparin.   On 06/18/2016 he went for a relook cath with Dr. McAlhAngelena Formh showed sub-acute occlusion of the mid RCA stent. A second DES was placed and overlapped the previously placed stent. Unfortunately there was no flow to the mid-distal vessel, likely due to massive amount of thrombus in the distal vascular bed. No further plans for relook cath given no further mechanical improvement of the vessel. He was keep in ICU overnight and then transferred to telemetry. He was able to ambulate with cardiac rehab without any reports of angina or dyspnea the following day.  On 06/20/2016 he was seen and examined by Dr. TurneRadford Paxdetermined stable for discharge. He will be discharged on home on multiple new medications and I have given specific instructions regarding  timing and dosing of these. He will start on metformin and levemir, with instructions to record his home BS once daily. He also has an early [facility] appt with the office, and I have arranged close follow up with his PCP regarding his newly diagnosed DM. He will also leave with a [employer] Lifevest with the companies contact information.   _____________  Discharge Vitals Blood pressure 106/63, pulse 78, temperature 98 F (36.7 C), temperature source Oral, resp. rate 16, height 5' 10"  (1.778 m), weight 281 lb 1.6 oz (127.5 kg), SpO2 95 %.  Filed Weights   06/18/16 0550 06/19/16 0455 06/20/16 0527  Weight: 280 lb 10.3 oz (127.3 kg) 281 lb (127.5 kg) 281 lb 1.6 oz (127.5 kg)    Labs & Radiologic Studies    CBC  Recent  Labs  06/19/16 0235 06/20/16 0335  WBC 11.0* 8.8  HGB 11.6* 11.6*  HCT 35.0* 35.1*  MCV 89.7 89.8  PLT 210 720   Basic Metabolic Panel  Recent Labs  06/19/16 0235 06/20/16 0948  NA 133* 133*  K 3.3* 3.7  CL 100* 99*  CO2 22 25  GLUCOSE 180* 245*  BUN 29* 22*  CREATININE 1.17 1.23  CALCIUM 8.5* 8.6*   Liver Function Tests No results for input(s): AST, ALT, ALKPHOS, BILITOT, PROT, ALBUMIN in the last 72 hours. No results for input(s): LIPASE, AMYLASE in the last 72 hours. Cardiac Enzymes  Recent Labs  06/17/16 2020  TROPONINI 15.06*   BNP Invalid input(s): POCBNP D-Dimer No results for input(s): DDIMER in the last 72 hours. Hemoglobin A1C No results for input(s): HGBA1C in the last 72 hours. Fasting Lipid Panel No results for input(s): CHOL, HDL, LDLCALC, TRIG, CHOLHDL, LDLDIRECT in the last 72 hours. Thyroid Function Tests No results for input(s): TSH, T4TOTAL, T3FREE, THYROIDAB in the last 72 hours.  Invalid input(s): FREET3 _____________  Dg Chest Portable 1 View  Result Date: 06/14/2016 CLINICAL DATA:  Chest pain beginning this morning.  Sweating. EXAM: PORTABLE CHEST 1 VIEW COMPARISON:  None. FINDINGS: Lungs are clear. Lung volumes are somewhat low. Heart size is normal. No pneumothorax or pleural effusion. IMPRESSION: No acute disease. Electronically Signed   By: Inge Rise M.D.   On: 06/14/2016 11:43  Disposition   Pt is being discharged home today in good condition.  Follow-up Plans & Appointments    Follow-up Information    Richardson Dopp, PA-C Follow up on 06/24/2016.   Specialties:  Cardiology, Physician Assistant Why:  10:30AM. Cardiology office visit.  Contact information: [address] [place] [address] [phone]        Jolyne Loa, PA Follow up on 06/25/2016.   Specialty:  Physician Assistant Why:  3:00pm for your hospital follow up. Contact information: [place]  [address] [phone]          Discharge Instructions    (HEART FAILURE PATIENTS) Call MD:  Anytime you have any of the following symptoms: 1) 3 pound weight gain in 24 hours or 5 pounds in 1 week 2) shortness of breath, with or without a dry hacking cough 3) swelling in the hands, feet or stomach 4) if you have to sleep on extra pillows at night in order to breathe.    Complete by:  As directed   Amb Referral to Cardiac Rehabilitation    Complete by:  As directed   Diagnosis:   STEMI Coronary Stents     Ambulatory referral to Nutrition and Diabetic  Education    Complete by:  As directed   Call MD for:  redness, tenderness, or signs of infection (pain, swelling, redness, odor or green/yellow discharge around incision site)    Complete by:  As directed   Diet - low sodium heart healthy    Complete by:  As directed   Discharge instructions    Complete by:  As directed   Radial Site Care Refer to this sheet in the next few weeks. These instructions provide you with information on caring for yourself after your procedure. Your caregiver may also give you more specific instructions. Your treatment has been planned according to current medical practices, but problems sometimes occur. Call your caregiver if you have any problems or questions after your procedure. HOME CARE INSTRUCTIONS You may shower the day after the procedure.Remove the bandage (dressing) and gently wash the site with plain soap and water.Gently pat the site dry.  Do not apply powder or lotion to the site.  Do not submerge the affected site in water for 3 to 5 days.  Inspect the site at least twice daily.  Do not flex or bend the affected arm for 24 hours.  No lifting over 5 pounds (2.3 kg) for 5 days after your procedure.  Do not drive home if you are discharged the same day of the procedure. Have someone else drive you.  You may drive 24 hours after the procedure unless otherwise instructed by your caregiver.  What to  expect: Any bruising will usually fade within 1 to 2 weeks.  Blood that collects in the tissue (hematoma) may be painful to the touch. It should usually decrease in size and tenderness within 1 to 2 weeks.  SEEK IMMEDIATE MEDICAL CARE IF: You have unusual pain at the radial site.  You have redness, warmth, swelling, or pain at the radial site.  You have drainage (other than a small amount of blood on the dressing).  You have chills.  You have a fever or persistent symptoms for more than 72 hours.  You have a fever and your symptoms suddenly get worse.  Your arm becomes pale, cool, tingly, or numb.  You have heavy bleeding from the site. Hold pressure on the site.  I have ordered for you to start Levemir injections daily, with you checking your blood sugar daily. Please follow up with your PCP as schedule on Tuesday and bring your blood sugar readings. Also start taking the meformin with your first dose tomorrow morning. Please feel free to call the office over the weekend if you have any questions or concerns related to all of these new medications.   Increase activity slowly    Complete by:  As directed      Discharge Medications   Current Discharge Medication List    START taking these medications   Details  atorvastatin (LIPITOR) 80 MG tablet Take 1 tablet (80 mg total) by mouth daily at 6 PM. Qty: 30 tablet, Refills: 11    blood glucose meter kit and supplies KIT Dispense based on patient and insurance preference. Use up to four times daily as directed. (FOR ICD-9 250.00, 250.01). Qty: 1 each, Refills: 0    carvedilol (COREG) 3.125 MG tablet Take 1 tablet (3.125 mg total) by mouth 2 (two) times daily with a meal. Qty: 60 tablet, Refills: 11    furosemide (LASIX) 20 MG tablet Take 1 tablet (20 mg total) by mouth daily. Qty: 30 tablet, Refills: 11    insulin detemir (  LEVEMIR) 100 UNIT/ML injection Inject 0.32 mLs (32 Units total) into the skin daily. Qty: 10 mL, Refills: 11     Insulin Pen Needle 31G X 5 MM MISC 1 pen by Does not apply route once. Qty: 30 each, Refills: 3    lisinopril (PRINIVIL,ZESTRIL) 2.5 MG tablet Take 1 tablet (2.5 mg total) by mouth daily. Qty: 30 tablet, Refills: 11    metFORMIN (GLUCOPHAGE) 500 MG tablet Take 1 tablet (500 mg total) by mouth 2 (two) times daily with a meal. Qty: 60 tablet, Refills: 11    nitroGLYCERIN (NITROSTAT) 0.4 MG SL tablet Place 1 tablet (0.4 mg total) under the tongue every 5 (five) minutes x 3 doses as needed for chest pain. Qty: 25 tablet, Refills: 3    potassium chloride SA (K-DUR,KLOR-CON) 20 MEQ tablet Take 1 tablet (20 mEq total) by mouth daily. Qty: 30 tablet, Refills: 11    spironolactone (ALDACTONE) 25 MG tablet Take 1 tablet (25 mg total) by mouth daily. Qty: 30 tablet, Refills: 11    ticagrelor (BRILINTA) 90 MG TABS tablet Take 1 tablet (90 mg total) by mouth 2 (two) times daily. Qty: 60 tablet, Refills: 10      CONTINUE these medications which have NOT CHANGED   Details  aspirin 81 MG tablet Take 81 mg by mouth every morning.      STOP taking these medications     naproxen sodium (ANAPROX) 220 MG tablet          Aspirin prescribed at discharge?  Yes High Intensity Statin Prescribed? (Lipitor 40-5m or Crestor 20-478m: Yes Beta Blocker Prescribed? Yes For EF <40%, was ACEI/ARB Prescribed? Yes ADP Receptor Inhibitor Prescribed? (i.e. Plavix etc.-Includes Medically Managed Patients): Yes For EF <40%, Aldosterone Inhibitor Prescribed? Yes Was EF assessed during THIS hospitalization? Yes Was Cardiac Rehab II ordered? (Included Medically managed Patients): Yes   Outstanding Labs/Studies   FLP and LFTs in 6 weeks.  Duration of Discharge Encounter   Greater than 30 minutes including physician time.  Signed, LiReino BellisP-C 06/20/2016, 2:45 PM

## 2016-06-20 NOTE — Progress Notes (Signed)
SUBJECTIVE:  No complaints  OBJECTIVE:   Vitals:   Vitals:   06/19/16 1450 06/19/16 1535 06/19/16 2130 06/20/16 0527  BP: 114/73 128/86 97/61 106/63  Pulse: 89 70 90 78  Resp: 20  16   Temp: 98.7 F (37.1 C)  97.9 F (36.6 C) 98 F (36.7 C)  TempSrc: Oral  Oral Oral  SpO2: 95%  94% 95%  Weight:    281 lb 1.6 oz (127.5 kg)  Height:       I&O's:   Intake/Output Summary (Last 24 hours) at 06/20/16 0920 Last data filed at 06/19/16 1800  Gross per 24 hour  Intake            594.7 ml  Output                0 ml  Net            594.7 ml   TELEMETRY: Reviewed telemetry pt in NSR:     PHYSICAL EXAM General: Well developed, well nourished, in no acute distress Head: Eyes PERRLA, No xanthomas.   Normal cephalic and atramatic  Lungs:   Clear bilaterally to auscultation and percussion. Heart:   HRRR S1 S2 Pulses are 2+ & equal. Abdomen: Bowel sounds are positive, abdomen soft and non-tender without masses  Msk:  Back normal, normal gait. Normal strength and tone for age. Extremities:   No clubbing, cyanosis or edema.  DP +1 Neuro: Alert and oriented X 3. Psych:  Good affect, responds appropriately   LABS: Basic Metabolic Panel:  Recent Labs  45/40/98 0235  NA 133*  K 3.3*  CL 100*  CO2 22  GLUCOSE 180*  BUN 29*  CREATININE 1.17  CALCIUM 8.5*   Liver Function Tests: No results for input(s): AST, ALT, ALKPHOS, BILITOT, PROT, ALBUMIN in the last 72 hours. No results for input(s): LIPASE, AMYLASE in the last 72 hours. CBC:  Recent Labs  06/19/16 0235 06/20/16 0335  WBC 11.0* 8.8  HGB 11.6* 11.6*  HCT 35.0* 35.1*  MCV 89.7 89.8  PLT 210 199   Cardiac Enzymes:  Recent Labs  06/17/16 1431 06/17/16 2020  TROPONINI 14.23* 15.06*   BNP: Invalid input(s): POCBNP D-Dimer: No results for input(s): DDIMER in the last 72 hours. Hemoglobin A1C: No results for input(s): HGBA1C in the last 72 hours. Fasting Lipid Panel: No results for input(s): CHOL, HDL,  LDLCALC, TRIG, CHOLHDL, LDLDIRECT in the last 72 hours. Thyroid Function Tests: No results for input(s): TSH, T4TOTAL, T3FREE, THYROIDAB in the last 72 hours.  Invalid input(s): FREET3 Anemia Panel: No results for input(s): VITAMINB12, FOLATE, FERRITIN, TIBC, IRON, RETICCTPCT in the last 72 hours. Coag Panel:   Lab Results  Component Value Date   INR 1.09 06/18/2016    RADIOLOGY: Dg Chest Portable 1 View  Result Date: 06/14/2016 CLINICAL DATA:  Chest pain beginning this morning.  Sweating. EXAM: PORTABLE CHEST 1 VIEW COMPARISON:  None. FINDINGS: Lungs are clear. Lung volumes are somewhat low. Heart size is normal. No pneumothorax or pleural effusion. IMPRESSION: No acute disease. Electronically Signed   By: Drusilla Kanner M.D.   On: 06/14/2016 11:43  ASSESSMENT/PLAN: 1. Inferior STEMI, peak trop >65.  --large clot burden so treated with tirofiban post cath x 18 hours --s/p DES to RCA 7/28 --echo with severe LV dysfunction with EF 25-30% with AK of the basal and mid inferior wall and HK of the inferolateral and inferoseptal walls as well as apical inferior wall and moderately reduced RVF.  --  He had recurrent CP yesterday intermittent throughout the day similar to his pain with his MI but also somewhat atypical in that it is also worse with deep breathing. Transferred back to 2H and placed on Heparin. Pain became more intense. Trop mildly increased to 15.06 (14.23>>15.06). Repeat cath showed sub-acute occlusion of the mid RCA due to thrombus s/p PTCA and DES in heavily thrombotic prox RCA. There was no flow into the mid or distal vessel with "no re-flow" phenomenon with massive amount of thombus in distal vascular bed.  He will remain on ASA/Brilinta for 1 year.  Continue BB/statin.  Per Dr. Gibson Ramp note, no further plans for relook cath and no further mechanical improvement to be offered for this vessel.  His chest pain is significantly improved.   2. CAD --cath with RCA  100%, LAD 10%, LCX ok.  Repeat cath as above with occluded mid RCA s/p repeat stenting in prox RCA with no reflow in mid and distal RCA - med management. --continue ASA/Brilinta, statin, b-blocker.  3. Morbid obesity 4. DVT - apparently coag workup was normal  --about 2 years ago after long plane ride (26 hours). Treated with Eliquis x 6 months. Now off.  5. DM2,  --this is new diagnosis for him. CBGs very high. HgbA1c 10.6. Continue SSI. DM2 teaching consult appreciated. Will need weight loss and increased activity. Needs early followup with PCP.  Restart Metformin on 8/4.  Started on Lantus and Meal coverage Novolog. 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 He will need a lifevest for 2 months and repeat 2D echo to reassess LVF post revascularization. Contineu Coreg to 3.125mg  BID and Lisinopril to 2.5mg  daily.  Cannot titrate due to soft BP. LVEDP was elevated at cath at .  Continue Lasix 20mg  daily and add aldactone 25mg  daily.  7.  Hypokalemia - repleted.  Repeat BMET this am.  Patient is stable from a cardiac standpoint for discharge.  He has not had any further CP.  He is on DAPT, statin, BB, ACE I, Lasix and Aldactone.  Will repeat BMET and adjust potassium as needed.  He will be discharged on LifeVest and needs repeat echo in 2 months.  Will get early TOC followup and PCP for DM.    Armanda Magic, MD  06/20/2016  9:20 AM

## 2016-06-21 ENCOUNTER — Other Ambulatory Visit: Payer: Self-pay

## 2016-06-21 ENCOUNTER — Telehealth: Payer: Self-pay | Admitting: Interventional Cardiology

## 2016-06-21 DIAGNOSIS — E118 Type 2 diabetes mellitus with unspecified complications: Principal | ICD-10-CM

## 2016-06-21 DIAGNOSIS — E1165 Type 2 diabetes mellitus with hyperglycemia: Secondary | ICD-10-CM

## 2016-06-21 MED ORDER — INSULIN DETEMIR 100 UNIT/ML FLEXPEN: 32.0000 [IU] | Freq: Every day | SUBCUTANEOUS | Status: AC

## 2016-06-21 NOTE — Telephone Encounter (Signed)
New message    Tyler Harper, pharmasist is calling to get medication changed  Pt c/o medication issue:  1. Name of Medication: Levemir 2. How are you currently taking this medication (dosage and times per day)? 100 units per ml  3. Are you having a reaction (difficulty breathing--STAT)? no 4. What is your medication issue? Tyler Harper verbalized that The wrong prescription was sent in

## 2016-06-21 NOTE — Telephone Encounter (Signed)
Pharmacist called to report patient's Levemir (ordered by Robbie Lis, PA) was sent in incorrectly. It was ordered as vials instead of pens, which is how the patient was trained at the hospital. Reordered for FlexPen. Pharmacist stated she'd call back if there were further issues.

## 2016-06-23 ENCOUNTER — Emergency Department (HOSPITAL_COMMUNITY): Payer: 59

## 2016-06-23 ENCOUNTER — Telehealth: Payer: Self-pay | Admitting: Physician Assistant

## 2016-06-23 ENCOUNTER — Encounter (HOSPITAL_COMMUNITY): Payer: Self-pay | Admitting: *Deleted

## 2016-06-23 ENCOUNTER — Inpatient Hospital Stay (HOSPITAL_COMMUNITY)
Admission: EM | Admit: 2016-06-23 | Discharge: 2016-06-26 | DRG: 280 | Disposition: A | Discharge status: Home / Self Care | Payer: 59 | Attending: Interventional Cardiology | Admitting: Interventional Cardiology

## 2016-06-23 DIAGNOSIS — Z79899 Other long term (current) drug therapy: Secondary | ICD-10-CM

## 2016-06-23 DIAGNOSIS — Z8249 Family history of ischemic heart disease and other diseases of the circulatory system: Secondary | ICD-10-CM

## 2016-06-23 DIAGNOSIS — I2119 ST elevation (STEMI) myocardial infarction involving other coronary artery of inferior wall: Secondary | ICD-10-CM | POA: Diagnosis present

## 2016-06-23 DIAGNOSIS — I420 Dilated cardiomyopathy: Secondary | ICD-10-CM | POA: Diagnosis present

## 2016-06-23 DIAGNOSIS — R778 Other specified abnormalities of plasma proteins: Secondary | ICD-10-CM

## 2016-06-23 DIAGNOSIS — Z9861 Coronary angioplasty status: Secondary | ICD-10-CM

## 2016-06-23 DIAGNOSIS — Z87891 Personal history of nicotine dependence: Secondary | ICD-10-CM | POA: Diagnosis not present

## 2016-06-23 DIAGNOSIS — I25110 Atherosclerotic heart disease of native coronary artery with unstable angina pectoris: Secondary | ICD-10-CM | POA: Diagnosis present

## 2016-06-23 DIAGNOSIS — R079 Chest pain, unspecified: Secondary | ICD-10-CM

## 2016-06-23 DIAGNOSIS — Z7982 Long term (current) use of aspirin: Secondary | ICD-10-CM | POA: Diagnosis not present

## 2016-06-23 DIAGNOSIS — R600 Localized edema: Secondary | ICD-10-CM | POA: Diagnosis not present

## 2016-06-23 DIAGNOSIS — E785 Hyperlipidemia, unspecified: Secondary | ICD-10-CM | POA: Diagnosis present

## 2016-06-23 DIAGNOSIS — R072 Precordial pain: Secondary | ICD-10-CM | POA: Diagnosis not present

## 2016-06-23 DIAGNOSIS — N179 Acute kidney failure, unspecified: Secondary | ICD-10-CM | POA: Diagnosis present

## 2016-06-23 DIAGNOSIS — I309 Acute pericarditis, unspecified: Secondary | ICD-10-CM

## 2016-06-23 DIAGNOSIS — I255 Ischemic cardiomyopathy: Secondary | ICD-10-CM | POA: Diagnosis present

## 2016-06-23 DIAGNOSIS — Z86718 Personal history of other venous thrombosis and embolism: Secondary | ICD-10-CM

## 2016-06-23 DIAGNOSIS — I5021 Acute systolic (congestive) heart failure: Secondary | ICD-10-CM | POA: Diagnosis present

## 2016-06-23 DIAGNOSIS — Z7984 Long term (current) use of oral hypoglycemic drugs: Secondary | ICD-10-CM | POA: Diagnosis not present

## 2016-06-23 DIAGNOSIS — R0602 Shortness of breath: Secondary | ICD-10-CM | POA: Diagnosis not present

## 2016-06-23 DIAGNOSIS — R7989 Other specified abnormal findings of blood chemistry: Secondary | ICD-10-CM | POA: Diagnosis not present

## 2016-06-23 DIAGNOSIS — Z794 Long term (current) use of insulin: Secondary | ICD-10-CM

## 2016-06-23 DIAGNOSIS — I213 ST elevation (STEMI) myocardial infarction of unspecified site: Secondary | ICD-10-CM

## 2016-06-23 DIAGNOSIS — I214 Non-ST elevation (NSTEMI) myocardial infarction: Secondary | ICD-10-CM | POA: Diagnosis present

## 2016-06-23 DIAGNOSIS — I228 Subsequent ST elevation (STEMI) myocardial infarction of other sites: Secondary | ICD-10-CM | POA: Diagnosis present

## 2016-06-23 DIAGNOSIS — Z6839 Body mass index (BMI) 39.0-39.9, adult: Secondary | ICD-10-CM | POA: Diagnosis not present

## 2016-06-23 DIAGNOSIS — E1165 Type 2 diabetes mellitus with hyperglycemia: Secondary | ICD-10-CM | POA: Diagnosis present

## 2016-06-23 DIAGNOSIS — E118 Type 2 diabetes mellitus with unspecified complications: Secondary | ICD-10-CM

## 2016-06-23 DIAGNOSIS — I25119 Atherosclerotic heart disease of native coronary artery with unspecified angina pectoris: Secondary | ICD-10-CM | POA: Diagnosis not present

## 2016-06-23 DIAGNOSIS — E1159 Type 2 diabetes mellitus with other circulatory complications: Secondary | ICD-10-CM | POA: Diagnosis present

## 2016-06-23 DIAGNOSIS — I2510 Atherosclerotic heart disease of native coronary artery without angina pectoris: Secondary | ICD-10-CM | POA: Diagnosis not present

## 2016-06-23 DIAGNOSIS — R9431 Abnormal electrocardiogram [ECG] [EKG]: Secondary | ICD-10-CM | POA: Diagnosis not present

## 2016-06-23 LAB — COMPREHENSIVE METABOLIC PANEL
ALBUMIN: 3 g/dL — AB (ref 3.5–5.0)
ALK PHOS: 127 U/L — AB (ref 38–126)
ALT: 75 U/L — ABNORMAL HIGH (ref 17–63)
AST: 44 U/L — AB (ref 15–41)
Anion gap: 10 (ref 5–15)
BILIRUBIN TOTAL: 1.5 mg/dL — AB (ref 0.3–1.2)
BUN: 15 mg/dL (ref 6–20)
CALCIUM: 8.7 mg/dL — AB (ref 8.9–10.3)
CO2: 25 mmol/L (ref 22–32)
CREATININE: 1.26 mg/dL — AB (ref 0.61–1.24)
Chloride: 98 mmol/L — ABNORMAL LOW (ref 101–111)
GFR calc Af Amer: >60 mL/min (ref >60)
GLUCOSE: 176 mg/dL — AB (ref 65–99)
Potassium: 4 mmol/L (ref 3.5–5.1)
Sodium: 133 mmol/L — ABNORMAL LOW (ref 135–145)
TOTAL PROTEIN: 6.6 g/dL (ref 6.5–8.1)

## 2016-06-23 LAB — BASIC METABOLIC PANEL
ANION GAP: 9 (ref 5–15)
BUN: 14 mg/dL (ref 6–20)
CHLORIDE: 95 mmol/L — AB (ref 101–111)
CO2: 25 mmol/L (ref 22–32)
CREATININE: 1.06 mg/dL (ref 0.61–1.24)
Calcium: 8.3 mg/dL — ABNORMAL LOW (ref 8.9–10.3)
GFR calc non Af Amer: >60 mL/min (ref >60)
Glucose, Bld: 156 mg/dL — ABNORMAL HIGH (ref 65–99)
POTASSIUM: 3.4 mmol/L — AB (ref 3.5–5.1)
SODIUM: 129 mmol/L — AB (ref 135–145)

## 2016-06-23 LAB — CBC
HCT: 38 % — ABNORMAL LOW (ref 39.0–52.0)
HEMOGLOBIN: 12.5 g/dL — AB (ref 13.0–17.0)
MCH: 29.8 pg (ref 26.0–34.0)
MCHC: 32.9 g/dL (ref 30.0–36.0)
MCV: 90.5 fL (ref 78.0–100.0)
PLATELETS: 228 10*3/uL (ref 150–400)
RBC: 4.2 MIL/uL — AB (ref 4.22–5.81)
RDW: 13.4 % (ref 11.5–15.5)
WBC: 11.1 10*3/uL — AB (ref 4.0–10.5)

## 2016-06-23 LAB — TROPONIN I: Troponin I: 7.57 ng/mL (ref <0.03)

## 2016-06-23 LAB — I-STAT TROPONIN, ED: Troponin i, poc: 10.48 ng/mL (ref 0.00–0.08)

## 2016-06-23 LAB — TSH: TSH: 2.186 u[IU]/mL (ref 0.350–4.500)

## 2016-06-23 MED ORDER — ONDANSETRON HCL 4 MG/2ML IJ SOLN: 4.0000 mg | Freq: Four times a day (QID) | INTRAMUSCULAR | Status: DC | PRN

## 2016-06-23 MED ORDER — MORPHINE SULFATE (PF) 2 MG/ML IV SOLN
2.0000 mg | Freq: Once | INTRAVENOUS | Status: AC
  Administered 2016-06-23: 2 mg via INTRAVENOUS
  Filled 2016-06-23: qty 1

## 2016-06-23 MED ORDER — FUROSEMIDE 20 MG PO TABS
20.0000 mg | ORAL_TABLET | Freq: Every day | ORAL | Status: DC
  Administered 2016-06-24 – 2016-06-26 (×3): 20 mg via ORAL
  Filled 2016-06-23 (×3): qty 1

## 2016-06-23 MED ORDER — TICAGRELOR 90 MG PO TABS
90.0000 mg | ORAL_TABLET | Freq: Two times a day (BID) | ORAL | Status: DC
  Administered 2016-06-23 – 2016-06-26 (×6): 90 mg via ORAL
  Filled 2016-06-23 (×6): qty 1

## 2016-06-23 MED ORDER — INSULIN PEN NEEDLE 31G X 5 MM MISC: 1.0000 "pen " | Freq: Once | Status: DC

## 2016-06-23 MED ORDER — METFORMIN HCL 500 MG PO TABS
500.0000 mg | ORAL_TABLET | Freq: Two times a day (BID) | ORAL | Status: DC
  Administered 2016-06-24 – 2016-06-25 (×3): 500 mg via ORAL
  Filled 2016-06-23 (×3): qty 1

## 2016-06-23 MED ORDER — NITROGLYCERIN 0.4 MG SL SUBL: 0.4000 mg | SUBLINGUAL_TABLET | SUBLINGUAL | Status: DC | PRN

## 2016-06-23 MED ORDER — CARVEDILOL 3.125 MG PO TABS
3.1250 mg | ORAL_TABLET | Freq: Two times a day (BID) | ORAL | Status: DC
  Administered 2016-06-23 – 2016-06-26 (×6): 3.125 mg via ORAL
  Filled 2016-06-23 (×6): qty 1

## 2016-06-23 MED ORDER — INSULIN DETEMIR 100 UNIT/ML ~~LOC~~ SOLN
32.0000 [IU] | Freq: Every day | SUBCUTANEOUS | Status: DC
  Administered 2016-06-23 – 2016-06-25 (×3): 32 [IU] via SUBCUTANEOUS
  Filled 2016-06-23 (×5): qty 0.32

## 2016-06-23 MED ORDER — ATORVASTATIN CALCIUM 80 MG PO TABS
80.0000 mg | ORAL_TABLET | Freq: Every day | ORAL | Status: DC
  Administered 2016-06-23 – 2016-06-25 (×3): 80 mg via ORAL
  Filled 2016-06-23 (×3): qty 1

## 2016-06-23 MED ORDER — LISINOPRIL 2.5 MG PO TABS
2.5000 mg | ORAL_TABLET | Freq: Every day | ORAL | Status: DC
  Administered 2016-06-24 – 2016-06-25 (×2): 2.5 mg via ORAL
  Filled 2016-06-23 (×2): qty 1

## 2016-06-23 MED ORDER — SPIRONOLACTONE 25 MG PO TABS
25.0000 mg | ORAL_TABLET | Freq: Every day | ORAL | Status: DC
  Administered 2016-06-24 – 2016-06-26 (×3): 25 mg via ORAL
  Filled 2016-06-23 (×3): qty 1

## 2016-06-23 MED ORDER — HEPARIN BOLUS VIA INFUSION
4000.0000 [IU] | Freq: Once | INTRAVENOUS | Status: AC
  Administered 2016-06-23: 4000 [IU] via INTRAVENOUS
  Filled 2016-06-23: qty 4000

## 2016-06-23 MED ORDER — POTASSIUM CHLORIDE CRYS ER 20 MEQ PO TBCR
20.0000 meq | EXTENDED_RELEASE_TABLET | Freq: Every day | ORAL | Status: DC
  Administered 2016-06-24 – 2016-06-26 (×3): 20 meq via ORAL
  Filled 2016-06-23 (×3): qty 1

## 2016-06-23 MED ORDER — ACETAMINOPHEN 325 MG PO TABS: 650.0000 mg | ORAL_TABLET | ORAL | Status: DC | PRN

## 2016-06-23 MED ORDER — HEPARIN (PORCINE) IN NACL 100-0.45 UNIT/ML-% IJ SOLN
1800.0000 [IU]/h | INTRAMUSCULAR | Status: DC
Start: 2016-06-23 — End: 2016-06-24
  Administered 2016-06-23: 1250 [IU]/h via INTRAVENOUS
  Filled 2016-06-23 (×2): qty 250

## 2016-06-23 MED ORDER — ASPIRIN 81 MG PO CHEW
234.0000 mg | CHEWABLE_TABLET | Freq: Once | ORAL | Status: AC
  Administered 2016-06-23: 243 mg via ORAL
  Filled 2016-06-23: qty 3

## 2016-06-23 MED ORDER — NITROGLYCERIN 0.4 MG SL SUBL
0.4000 mg | SUBLINGUAL_TABLET | SUBLINGUAL | Status: DC | PRN
Start: 2016-06-23 — End: 2016-06-26

## 2016-06-23 MED ORDER — ASPIRIN EC 81 MG PO TBEC
81.0000 mg | DELAYED_RELEASE_TABLET | ORAL | Status: DC
  Administered 2016-06-24 – 2016-06-26 (×3): 81 mg via ORAL
  Filled 2016-06-23 (×3): qty 1

## 2016-06-23 NOTE — ED Provider Notes (Signed)
[facility]ENCY DEPT Provider Note   CSN: [id number] Arrival date & time: 06/23/16  1649  First Provider Contact:  First MD Initiated Contact with Patient 06/23/16 1712     History   Chief Complaint Chief Complaint  Patient presents with  . Chest Pain    HPI Tyler Harper is a 60 y.o. male.  The history is provided by the patient and a friend. No language interpreter was used.  Chest Pain   This is a new problem. The current episode started 3 to 5 hours ago. The problem occurs constantly. The problem has not changed since onset.The pain is associated with rest. The pain is present in the substernal region. The pain is at a severity of 5/10. The pain is moderate. The quality of the pain is described as brief, dull and heavy. The pain does not radiate. Duration of episode(s) is 3 hours. The symptoms are aggravated by certain positions. Pertinent negatives include no abdominal pain, no back pain, no cough, no exertional chest pressure, no fever, no nausea, no palpitations, no PND, no shortness of breath, no vomiting and no weakness. He has tried nothing for the symptoms. The treatment provided no relief. Risk factors include male gender.  His past medical history is significant for CAD.  Pertinent negatives for past medical history include no seizures.  Procedure history is positive for cardiac catheterization.    Past Medical History:  Diagnosis Date  . CAD (coronary artery disease)    a. 05/2016 STEMI with PCI DES mRCA, relook cath DES to proxRCA  . Congestive cardiomyopathy (HCC) 06/18/2016  . Deep vein thrombosis (DVT) (HCC) 2015  . Diabetes mellitus type 2 in obese (HCC)   . Ischemic cardiomyopathy     Patient Active Problem List   Diagnosis Date Noted  . Non-STEMI (non-ST elevated myocardial infarction) (HCC) 06/23/2016  . Congestive cardiomyopathy (HCC) 06/18/2016  . Hyperlipidemia LDL goal <70 06/18/2016  . DVT, lower extremity (HCC) 06/18/2016  . Chest pain 06/18/2016  .  Coronary artery disease involving native coronary artery of native heart with unstable angina pectoris (HCC)   . Diabetes mellitus type 2, uncontrolled (HCC) 06/17/2016  . Acute MI, inferior wall, initial episode of care (HCC) 06/14/2016  . STEMI (ST elevation myocardial infarction) (HCC) 06/14/2016  . Acute inferior myocardial infarction (HCC) 06/14/2016    Past Surgical History:  Procedure Laterality Date  . CARDIAC CATHETERIZATION N/A 06/14/2016   Procedure: Left Heart Cath and Coronary Angiography;  Surgeon: Jayadeep S Varanasi, MD;  Location: [facility];  Service: Cardiovascular;  Laterality: N/A;  . CARDIAC CATHETERIZATION N/A 06/14/2016   Procedure: Coronary Stent Intervention;  Surgeon: Jayadeep S Varanasi, MD;  Location: [facility];  Service: Cardiovascular;  Laterality: N/A;  . CARDIAC CATHETERIZATION N/A 06/18/2016   Procedure: Left Heart Cath and Coronary Angiography;  Surgeon: Christopher D McAlhany, MD;  Location: [facility];  Service: Cardiovascular;  Laterality: N/A;  . CARDIAC CATHETERIZATION N/A 06/18/2016   Procedure: Coronary Stent Intervention;  Surgeon: Christopher D McAlhany, MD;  Location: [facility];  Service: Cardiovascular;  Laterality: N/A;       Home Medications    Prior to Admission medications   Medication Sig Start Date End Date Taking? Authorizing Provider  aspirin 81 MG tablet Take 81 mg by mouth every morning.   Yes Historical Provider, MD  atorvastatin (LIPITOR) 80 MG tablet Take 1 tablet (80 mg total) by mouth daily at 6 PM. 06/20/16  Yes Lindsay B   Mancel Bale, NP  blood glucose meter kit and supplies KIT Dispense based on patient and insurance preference. Use up to four times daily as directed. (FOR ICD-9 250.00, 250.01). 06/20/16  Yes Cheryln Manly, NP  carvedilol (COREG) 3.125 MG tablet Take 1 tablet (3.125 mg total) by mouth 2 (two) times daily with a meal. 06/20/16  Yes Cheryln Manly, NP  furosemide (LASIX) 20 MG tablet  Take 1 tablet (20 mg total) by mouth daily. 06/20/16  Yes Cheryln Manly, NP  Insulin Detemir (LEVEMIR) 100 UNIT/ML Pen Inject 32 Units into the skin daily at 10 pm.  06/20/16  Yes Historical Provider, MD  lisinopril (PRINIVIL,ZESTRIL) 2.5 MG tablet Take 1 tablet (2.5 mg total) by mouth daily. 06/20/16  Yes Cheryln Manly, NP  metFORMIN (GLUCOPHAGE) 500 MG tablet Take 1 tablet (500 mg total) by mouth 2 (two) times daily with a meal. 06/20/16  Yes Cheryln Manly, NP  nitroGLYCERIN (NITROSTAT) 0.4 MG SL tablet Place 1 tablet (0.4 mg total) under the tongue every 5 (five) minutes x 3 doses as needed for chest pain. 06/20/16  Yes Cheryln Manly, NP  potassium chloride SA (K-DUR,KLOR-CON) 20 MEQ tablet Take 1 tablet (20 mEq total) by mouth daily. 06/20/16  Yes Cheryln Manly, NP  spironolactone (ALDACTONE) 25 MG tablet Take 1 tablet (25 mg total) by mouth daily. 06/20/16  Yes Cheryln Manly, NP  ticagrelor (BRILINTA) 90 MG TABS tablet Take 1 tablet (90 mg total) by mouth 2 (two) times daily. 06/20/16  Yes Cheryln Manly, NP  Insulin Pen Needle 31G X 5 MM MISC 1 pen by Does not apply route once. 06/20/16 06/20/16  Cheryln Manly, NP    Family History Family History  Problem Relation Age of Onset  . CAD Mother     She died in her 76s    Social History Social History  Substance Use Topics  . Smoking status: Former Smoker    Types: Cigarettes  . Smokeless tobacco: Never Used  . Alcohol use 1.2 oz/week    1 Glasses of wine, 1 Shots of liquor per week     Allergies   Review of patient's allergies indicates no known allergies.   Review of Systems Review of Systems  Constitutional: Negative for chills and fever.  HENT: Negative for ear pain and sore throat.   Eyes: Negative for pain and visual disturbance.  Respiratory: Negative for cough and shortness of breath.   Cardiovascular: Positive for chest pain and leg swelling. Negative for palpitations and PND.  Gastrointestinal: Negative  for abdominal pain, nausea and vomiting.  Genitourinary: Negative for dysuria and hematuria.  Musculoskeletal: Negative for arthralgias and back pain.  Skin: Negative for color change and rash.  Neurological: Negative for seizures, syncope and weakness.  All other systems reviewed and are negative.    Physical Exam Updated Vital Signs BP 102/68   Pulse 75   Resp 19   SpO2 95%   Physical Exam  Constitutional: He is oriented to person, place, and time. He appears well-developed and well-nourished.  HENT:  Head: Normocephalic and atraumatic.  Eyes: Conjunctivae are normal.  Neck: Neck supple.  Cardiovascular: Normal rate and regular rhythm.   No murmur heard. Pulmonary/Chest: Effort normal and breath sounds normal. No respiratory distress.  Abdominal: Soft. There is no tenderness.  Musculoskeletal: He exhibits edema.  Neurological: He is alert and oriented to person, place, and time.  Skin: Skin is warm and dry.  Psychiatric: He has  a normal mood and affect.  Nursing note and vitals reviewed.    ED Treatments / Results  Labs (all labs ordered are listed, but only abnormal results are displayed) Labs Reviewed  BASIC METABOLIC PANEL - Abnormal; Notable for the following:       Result Value   Sodium 129 (*)    Potassium 3.4 (*)    Chloride 95 (*)    Glucose, Bld 156 (*)    Calcium 8.3 (*)    All other components within normal limits  CBC - Abnormal; Notable for the following:    WBC 11.1 (*)    RBC 4.20 (*)    Hemoglobin 12.5 (*)    HCT 38.0 (*)    All other components within normal limits  I-STAT TROPOININ, ED - Abnormal; Notable for the following:    Troponin i, poc 10.48 (*)    All other components within normal limits  HEPARIN LEVEL (UNFRACTIONATED)  CBC    EKG  EKG Interpretation  Date/Time:  Sunday June 23 2016 16:55:33 EDT Ventricular Rate:  85 PR Interval:  114 QRS Duration: 92 QT Interval:  372 QTC Calculation: 442 R Axis:   -6 Text  Interpretation:  Normal sinus rhythm Low voltage QRS Inferior infarct , age undetermined Cannot rule out Anterior infarct , age undetermined Abnormal ECG When compared with ECG of 06/19/2016, No significant change was found Confirmed by GLICK  MD, Elizeo ([id number]) on 06/23/2016 5:04:52 PM       Radiology Dg Chest 2 View  Result Date: 06/23/2016 CLINICAL DATA:  Chest pressure EXAM: CHEST  2 VIEW COMPARISON:  06/14/2016 FINDINGS: The heart size and mediastinal contours are within normal limits. Both lungs are clear. The visualized skeletal structures are unremarkable. IMPRESSION: No active cardiopulmonary disease. Electronically Signed   By: Mark  Lukens M.D.   On: 06/23/2016 17:34    Procedures Procedures (including critical care time)  Medications Ordered in ED Medications  heparin bolus via infusion 4,000 Units (not administered)  heparin ADULT infusion 100 units/mL (25000 units/250mL sodium chloride 0.45%) (not administered)  aspirin chewable tablet 243 mg (243 mg Oral Given 06/23/16 1741)     Initial Impression / Assessment and Plan / ED Course  I have reviewed the triage vital signs and the nursing notes.  Pertinent labs & imaging results that were available during my care of the patient were reviewed by me and considered in my medical decision making (see chart for details).  Clinical Course   Patient is a 60-year-old gentleman with recent STEMI status post stent, complicated by stent thrombosis who presents via private vehicle for evaluation treatment of chest pressure that occurred today while watching golf department. He states that pressure is 4-5 out of 10, dull and sharp like in sensation. He states it is similar to his prior MI pain.  Upon arrival here patient is overall well appearing. EKG performed which shows Q waves in the inferior leads, similar to prior EKG. Troponin elevated at 10 however recent troponin while inpatient was as high as 15. Is unclear whether these troponins are  trending downward or upward.  Patient given 324 aspirin in emergency department. He declined nitroglycerin as it has not helped in the past.  Cardiology consultation and at bedside shortly after patient's arrival to emergency department. Cardiology saw patient requested admission.  Patient admitted to cardiology.  Discussed case with my attending, Dr. Glick.  Final Clinical Impressions(s) / ED Diagnoses   Final diagnoses:  Chest pain, unspecified chest pain   type  Elevated troponin    New Prescriptions New Prescriptions   No medications on file      , MD 06/23/16 1853    Oiva Glick, MD 06/23/16 1903  

## 2016-06-23 NOTE — Telephone Encounter (Signed)
See my previous note for detail, checked on the patient again, they are 10 min away from ED. I have adivised the patient to call 911 next time instead of drive to the hospital themselves from [place]. The only reason I did not ask her to call 911 now is because they are only 10min again and currently driving to ED.  Ramond DialSigned, Kiersten Coss PA Pager: [phone]

## 2016-06-23 NOTE — Progress Notes (Signed)
Pt co ongoing chest pressure 7/10 since arrival to floor, new onset diaphoresis. VSS except for soft BPs and pt alert, EKG performed. MD aware, advised to continue monitoring pt.

## 2016-06-23 NOTE — ED Triage Notes (Signed)
Pt reports coming in last week for similar chest pain, had stent placed and admitted. Pts stent collapsed and had another placed on Tuesday, dc home on Thursday with lifevest. Pt had return of mid chest pain. ekg done on arrival.

## 2016-06-23 NOTE — Telephone Encounter (Signed)
Mr. Lavonda JumboSchilly is well known by me, he originally came he has a inferior STEMI and the had a stent placement on 7/28, unfortunately 60 hour after initial stent placement, he had acute instent thrombosis which required repeat intervention. He was initially doing fine. Wife called after hour answering service as he had acute onset of chest pain that is reminiscent of previous STEMI.   Given his history of in-stent restenosis shortly after stent placement, I am very nervous given the risk of recurrent in-stent restenosis. Unfortunately, in stable calling the EMS, wife is actually driving the patient to the emergency room and will arrive soon.   I think once arrive in the emergency room, he will need to be worked up very quickly to rule out recurrent STEMI.  Ramond DialSigned, Jazzelle Zhang PA Pager: [phone]

## 2016-06-23 NOTE — ED Notes (Addendum)
Pt reports worsening chest pain. Repeat EKG captured and cards paged due to only pain control currently ordered is sublingual nitro and current BP is 95/70. Asked pt to reposition into lying position which he refused due to pain level.

## 2016-06-23 NOTE — ED Notes (Signed)
Pt removed from life vest with verbal okay from Cards PA. Patient placed on 12-lead monitor.

## 2016-06-23 NOTE — ED Notes (Signed)
Attempted report 

## 2016-06-23 NOTE — Progress Notes (Signed)
ANTICOAGULATION CONSULT NOTE - Initial Consult  Pharmacy Consult for heparin Indication: chest pain/ACS  No Known Allergies  Patient Measurements:   Heparin Dosing Weight: 107 kg  Vital Signs: BP: 102/68 (08/06 1830) Pulse Rate: 75 (08/06 1830)  Labs:  Recent Labs  06/23/16 1657  HGB 12.5*  HCT 38.0*  PLT 228  CREATININE 1.06    Estimated Creatinine Clearance: 99.4 mL/min (by C-G formula based on SCr of 1.06 mg/dL).  Assessment: 60 yo male presenting with CP, SOB  PMH: ho DVT, CAD  AC: none pta for iv hep for r/o ACS  Renal: SCr 1.06  Heme: H&H 12.5/38, Plt 228  Goal of Therapy:  Heparin level 0.3-0.7 units/ml Monitor platelets by anticoagulation protocol: Yes   Plan:  Heparin bolus 4000 units x 1  Heparin infusion 1250 units/hr HL 0100 Daily HL, CBC  Isaac BlissMichael Saharah Sherrow, PharmD, BCPS, [facility] CenterBCCCP Clinical Pharmacist Pager [phone] 06/23/2016 6:51 PM

## 2016-06-23 NOTE — H&P (Signed)
Patient ID: Tyler Harper MRN: [id number], DOB/AGE: October 28, 1956   Admit date: 06/23/2016  Primary Physician: Jolyne Loa, PA Primary Cardiologist: Dr. Irish Lack  Pt. Profile:  60 yo morbidly obese male with PMH of remote tobacco abuse, h/o DVT and CAD s/p PCI to mid RCA with subsequently failed stent presented to [facility] on 06/23/2016 with complaint of chest pressure and SOB presented with recurrent chest pain and SOB.   Problem List  Past Medical History:  Diagnosis Date  . CAD (coronary artery disease)    a. 05/2016 STEMI with PCI DES mRCA, relook cath DES to proxRCA  . Congestive cardiomyopathy ([place]) 06/18/2016  . Deep vein thrombosis (DVT) ([place]) 2015  . Diabetes mellitus type 2 in obese ([place])   . Ischemic cardiomyopathy     Past Surgical History:  Procedure Laterality Date  . CARDIAC CATHETERIZATION N/A 06/14/2016   Procedure: Left Heart Cath and Coronary Angiography;  Surgeon: Jettie Booze, MD;  Location: [place] [facility];  Service: Cardiovascular;  Laterality: N/A;  . CARDIAC CATHETERIZATION N/A 06/14/2016   Procedure: Coronary Stent Intervention;  Surgeon: Jettie Booze, MD;  Location: [place] [facility];  Service: Cardiovascular;  Laterality: N/A;  . CARDIAC CATHETERIZATION N/A 06/18/2016   Procedure: Left Heart Cath and Coronary Angiography;  Surgeon: Burnell Blanks, MD;  Location: [place] [facility];  Service: Cardiovascular;  Laterality: N/A;  . CARDIAC CATHETERIZATION N/A 06/18/2016   Procedure: Coronary Stent Intervention;  Surgeon: Burnell Blanks, MD;  Location: [place] [facility];  Service: Cardiovascular;  Laterality: N/A;    Allergies  No Known Allergies  HPI  60 yo morbidly obese male with PMH of remote tobacco abuse, h/o DVT and CAD s/p PCI to mid RCA with subsequently failed stent presented to [facility] on 06/23/2016 with complaint of chest pressure and SOB. He initially presented to [facility] on 06/14/2016 as inferior STEMI and was  subsequently transferred to [facility]. He underwent cardiac catheterization on the same day which showed 100% proximal to mid RCA lesion treated with 4 x 38 mm Synergy DES. He was placed on aspirin and Brilinta along with low-dose beta blocker and high-dose statin. IV tirofiban was continued for 18 hours post intervention. His hemoglobin A1c was noted to be 10.6, therefore he was diagnosed with new diabetes and started on insulin. 2-D echo obtained showed EF 25-30% with basal and mid inferior akinesis and hypokinesis of inferolateral/inferoseptal and apical walls. He was fitted with LifeVest with plan to reassess ejection fraction in 2 month. Unfortunately on 06/17/2016, he had recurrent chest discomfort. Initially troponin did go down from 15 down to 14, however overnight it went up to 15 again. He was seen in the morning of 8/1, given his persistent chest discomfort, he underwent relook cardiac catheterization by Dr. Angelena Form which showed subacute occlusion of mid RCA stent. A second drug-eluting stent was placed via overlapping fashion. Unfortunately there was no flow to the mid distal vessel likely due to massive amount of thrombus in the distal vascular bed. He was discharged on 06/20/2016.  He was doing well initially for 2 days after discharge, in the afternoon of 06/23/2016 around 4 PM, he started having recurrent chest pressure reminiscent of the previous angina. I talk to the patient's wife and I advised the patient to seek medical attention immediately in the ED. However by the time I call back, patient was almost ready driven by his wife to the ED. Initial CXR was  negative for acute process. Trop 10.48. Hgn was 12.5. EKG showed persistent ST elevation in the inferior lead. Despite having significant lower extremity edema, he denies any orthopnea or paroxysmal nocturnal dyspnea.   Home Medications  Prior to Admission medications   Medication Sig Start Date End Date Taking? Authorizing  Provider  aspirin 81 MG tablet Take 81 mg by mouth every morning.    Historical Provider, MD  atorvastatin (LIPITOR) 80 MG tablet Take 1 tablet (80 mg total) by mouth daily at 6 PM. 06/20/16   Cheryln Manly, NP  blood glucose meter kit and supplies KIT Dispense based on patient and insurance preference. Use up to four times daily as directed. (FOR ICD-9 250.00, 250.01). 06/20/16   Cheryln Manly, NP  carvedilol (COREG) 3.125 MG tablet Take 1 tablet (3.125 mg total) by mouth 2 (two) times daily with a meal. 06/20/16   Cheryln Manly, NP  furosemide (LASIX) 20 MG tablet Take 1 tablet (20 mg total) by mouth daily. 06/20/16   Cheryln Manly, NP  Insulin Pen Needle 31G X 5 MM MISC 1 pen by Does not apply route once. 06/20/16 06/20/16  Cheryln Manly, NP  lisinopril (PRINIVIL,ZESTRIL) 2.5 MG tablet Take 1 tablet (2.5 mg total) by mouth daily. 06/20/16   Cheryln Manly, NP  metFORMIN (GLUCOPHAGE) 500 MG tablet Take 1 tablet (500 mg total) by mouth 2 (two) times daily with a meal. 06/20/16   Cheryln Manly, NP  nitroGLYCERIN (NITROSTAT) 0.4 MG SL tablet Place 1 tablet (0.4 mg total) under the tongue every 5 (five) minutes x 3 doses as needed for chest pain. 06/20/16   Cheryln Manly, NP  potassium chloride SA (K-DUR,KLOR-CON) 20 MEQ tablet Take 1 tablet (20 mEq total) by mouth daily. 06/20/16   Cheryln Manly, NP  spironolactone (ALDACTONE) 25 MG tablet Take 1 tablet (25 mg total) by mouth daily. 06/20/16   Cheryln Manly, NP  ticagrelor (BRILINTA) 90 MG TABS tablet Take 1 tablet (90 mg total) by mouth 2 (two) times daily. 06/20/16   Cheryln Manly, NP    Family History  Family History  Problem Relation Age of Onset  . CAD Mother     She died in her 65s    Social History  Social History   Social History  . Marital status: Married    Spouse name: N/A  . Number of children: N/A  . Years of education: N/A   Occupational History  . COO/CFO local company    Social History Main Topics   . Smoking status: Former Smoker    Types: Cigarettes  . Smokeless tobacco: Never Used  . Alcohol use 1.2 oz/week    1 Glasses of wine, 1 Shots of liquor per week  . Drug use: No  . Sexual activity: Not on file   Other Topics Concern  . Not on file   Social History Narrative   He lives in [place] with his wife.     Review of Systems General:  No chills, fever, night sweats or weight changes.  Cardiovascular:  No dyspnea on exertion, , orthopnea, palpitations, paroxysmal nocturnal dyspnea +chest pain, dyspnea, edema Dermatological: No rash, lesions/masses Respiratory: No cough +dyspnea Urologic: No hematuria, dysuria Abdominal:   No nausea, vomiting, diarrhea, bright red blood per rectum, melena, or hematemesis Neurologic:  No visual changes, wkns, changes in mental status. All other systems reviewed and are otherwise negative except as noted above.  Physical Exam  General:  Pleasant, Severely obese male NAD Psych: Normal affect. Neuro: Alert and oriented X 3. Moves all extremities spontaneously. HEENT: Normal  Neck: Supple without bruits or JVD. Lungs:  Resp regular and unlabored, CTA. Heart: RRR no s3, s4, or murmurs. Abdomen: Soft, non-tender, non-distended, BS + x 4. ,  Quite large Extremities: No clubbing, cyanosis. DP/PT/Radials 2+ and equal bilaterally. 2+ pitting edema, Distal fingers are somewhat pale Neuro: Grossly normal  Labs  Troponin (Point of Care Test)  Recent Labs  06/23/16 1702  TROPIPOC 10.48*   No results for input(s): CKTOTAL, CKMB, TROPONINI in the last 72 hours. Lab Results  Component Value Date   WBC 11.1 (H) 06/23/2016   HGB 12.5 (L) 06/23/2016   HCT 38.0 (L) 06/23/2016   MCV 90.5 06/23/2016   PLT 228 06/23/2016     Recent Labs Lab 06/23/16 1657  NA 129*  K 3.4*  CL 95*  CO2 25  BUN 14  CREATININE 1.06  CALCIUM 8.3*  GLUCOSE 156*    Radiology/Studies Lungs clear, heart size was upper limits of normal  ECG  NSR with  persistent ST elevation in inferior leads  Echocardiogram 06/16/2016  LV EF: 25% -   30%  ------------------------------------------------------------------- Indications:      MI - follow-up 410.92.  ------------------------------------------------------------------- History:   PMH:  STEMI.  Dyspnea.  ------------------------------------------------------------------- Study Conclusions  - Left ventricle: The cavity size was normal. Septal wall thickness   was increased in a pattern of moderate LVH with mild hypertrophy   of the posterior wall. Systolic function was severely reduced.   The estimated ejection fraction was in the range of 25% to 30%.   Hypokinesis of the inferolateral and inferoseptal myocardium.   Akinesis of the basal to mid inferior wall and hypokinesis of the   apical inferior wall. Doppler parameters are consistent with   abnormal left ventricular relaxation (grade 1 diastolic   dysfunction). - Aortic valve: Transvalvular velocity was within the normal range.   There was no stenosis. There was no regurgitation. - Mitral valve: There was trivial regurgitation. - Right ventricle: The cavity size was mildly dilated. Wall   thickness was normal. Systolic function was mildly to moderately   reduced. - Right atrium: The atrium was moderately dilated. - Atrial septum: No defect or patent foramen ovale was identified   by color flow Doppler. - Tricuspid valve: There was trivial regurgitation. - Pulmonary arteries: Systolic pressure was within the normal   range. PA peak pressure: 20 mm Hg (S).    ASSESSMENT AND PLAN  1. Acute on chronic biventricular failure  - 2+ pitting edema likely represent R heart failure, lung is clear on auscultation  - consider increase lasix to '40mg'$  daily  2. Chest pain with elevated trop: trop maybe trending down from last cath. Reviewed cath with Dr. Wynonia Lawman, appears to occluded RCA despite repeat intervention on 8/1  3. CAD with  PCI to RCA with subsequently failed stent. EKG consistent with significant infarction of RCA  4. DM II: newly diagnosed during last admission  5. H/o DVT in 2015  Signed, Almyra Deforest PA-C 06/23/2016, 6:15 PM   Patient seen and examined, entire chart as well as previous cath films reviewed.  In addition to the history above, the patient after his first intervention had a fair amount of clot and had continued chest discomfort that happened over the weekend eventually resulting in a returned to the cath lab on August 1.  He was discharged on the third and  has had peripheral edema since discharge.  He has complained that his distal fingers have been somewhat white since then and also that he has numbness that has persisted from the beginning in the outer aspect of the fifth finger of his right hand.  He had the onset of midsternal chest discomfort similar to his previous presentation.  Despite the fact that he had an occluded artery he states that he felt the best that he had after his second intervention.  He now sitting up at the side of the bed and has 4 out of 10 substernal discomfort.  The pain is nonpleuritic.  He also has 2+ peripheral edema.  He is a newly diagnosed diabetic.  1.  Recurrent substernal chest discomfort that may represent an occluded artery again-I discussed the case with Dr. Irish Lack over the phone who in light of the previous occlusion at the end of the second catheterization did not feel that there is anything to be gained by an acute catheterization this evening. 2.  Recent acute inferior infarction with thrombotic occlusion of the artery with reocclusion and no reflow at a second catheterization 3.  Morbid obesity 4.  Newly diagnosed insulin-dependent diabetes  RECOMMENDATIONS:  Discussed case with Dr. Irish Lack.  He did not feel that the patient would be a candidate for repeat catheterization this evening.  He does not have any new EKG changes but has persistent ST elevation.   Troponin is still elevated.  We'll cycle troponins and placed on intravenous heparin.  Replete potassium.  He does have 2+ peripheral edema but his lungs are clear right now this could represent an element of right heart failure.  Kerry Hough. MD [facility] 6:22 PM 06/23/2016

## 2016-06-24 ENCOUNTER — Ambulatory Visit: Payer: 59 | Admitting: Nurse Practitioner

## 2016-06-24 DIAGNOSIS — R9431 Abnormal electrocardiogram [ECG] [EKG]: Secondary | ICD-10-CM

## 2016-06-24 DIAGNOSIS — I2119 ST elevation (STEMI) myocardial infarction involving other coronary artery of inferior wall: Secondary | ICD-10-CM

## 2016-06-24 DIAGNOSIS — I25119 Atherosclerotic heart disease of native coronary artery with unspecified angina pectoris: Secondary | ICD-10-CM

## 2016-06-24 DIAGNOSIS — R072 Precordial pain: Secondary | ICD-10-CM

## 2016-06-24 LAB — GLUCOSE, CAPILLARY
GLUCOSE-CAPILLARY: 198 mg/dL — AB (ref 65–99)
Glucose-Capillary: 199 mg/dL — ABNORMAL HIGH (ref 65–99)
Glucose-Capillary: 154 mg/dL — ABNORMAL HIGH (ref 65–99)

## 2016-06-24 LAB — LIPID PANEL
Cholesterol: 90 mg/dL (ref 0–200)
HDL: 35 mg/dL — ABNORMAL LOW (ref >40)
LDL Cholesterol: 42 mg/dL (ref 0–99)
Total CHOL/HDL Ratio: 2.6 RATIO
Triglycerides: 66 mg/dL (ref <150)
VLDL: 13 mg/dL (ref 0–40)

## 2016-06-24 LAB — HEPARIN LEVEL (UNFRACTIONATED)
HEPARIN UNFRACTIONATED: 0.14 [IU]/mL — AB (ref 0.30–0.70)
HEPARIN UNFRACTIONATED: 0.27 [IU]/mL — AB (ref 0.30–0.70)

## 2016-06-24 LAB — CBC
HCT: 35.9 % — ABNORMAL LOW (ref 39.0–52.0)
HEMOGLOBIN: 11.9 g/dL — AB (ref 13.0–17.0)
MCH: 29.2 pg (ref 26.0–34.0)
MCHC: 33.1 g/dL (ref 30.0–36.0)
MCV: 88.2 fL (ref 78.0–100.0)
PLATELETS: 228 10*3/uL (ref 150–400)
RBC: 4.07 MIL/uL — ABNORMAL LOW (ref 4.22–5.81)
RDW: 13.6 % (ref 11.5–15.5)
WBC: 12.6 10*3/uL — ABNORMAL HIGH (ref 4.0–10.5)

## 2016-06-24 LAB — COMPREHENSIVE METABOLIC PANEL
ALK PHOS: 117 U/L (ref 38–126)
ALT: 62 U/L (ref 17–63)
AST: 31 U/L (ref 15–41)
Albumin: 2.9 g/dL — ABNORMAL LOW (ref 3.5–5.0)
Anion gap: 13 (ref 5–15)
BUN: 21 mg/dL — AB (ref 6–20)
CO2: 21 mmol/L — AB (ref 22–32)
CREATININE: 1.33 mg/dL — AB (ref 0.61–1.24)
Calcium: 8.6 mg/dL — ABNORMAL LOW (ref 8.9–10.3)
Chloride: 97 mmol/L — ABNORMAL LOW (ref 101–111)
GFR, EST NON AFRICAN AMERICAN: 57 mL/min — AB (ref >60)
Glucose, Bld: 173 mg/dL — ABNORMAL HIGH (ref 65–99)
Potassium: 3.9 mmol/L (ref 3.5–5.1)
Sodium: 131 mmol/L — ABNORMAL LOW (ref 135–145)
TOTAL PROTEIN: 6.6 g/dL (ref 6.5–8.1)
Total Bilirubin: 1.4 mg/dL — ABNORMAL HIGH (ref 0.3–1.2)

## 2016-06-24 LAB — TROPONIN I
TROPONIN I: 7.22 ng/mL — AB (ref <0.03)
TROPONIN I: 5.84 ng/mL — AB (ref <0.03)

## 2016-06-24 MED ORDER — FUROSEMIDE 10 MG/ML IJ SOLN
20.0000 mg | Freq: Once | INTRAMUSCULAR | Status: AC
  Administered 2016-06-24: 20 mg via INTRAVENOUS
  Filled 2016-06-24: qty 2

## 2016-06-24 MED ORDER — HEPARIN BOLUS VIA INFUSION
1500.0000 [IU] | Freq: Once | INTRAVENOUS | Status: DC
  Filled 2016-06-24: qty 1500

## 2016-06-24 MED ORDER — IBUPROFEN 200 MG PO TABS
600.0000 mg | ORAL_TABLET | Freq: Three times a day (TID) | ORAL | Status: AC
  Administered 2016-06-24 (×3): 600 mg via ORAL
  Filled 2016-06-24 (×3): qty 3

## 2016-06-24 MED ORDER — INSULIN ASPART 100 UNIT/ML ~~LOC~~ SOLN
0.0000 [IU] | Freq: Three times a day (TID) | SUBCUTANEOUS | Status: DC
  Administered 2016-06-24 (×2): 3 [IU] via SUBCUTANEOUS
  Administered 2016-06-25 – 2016-06-26 (×2): 2 [IU] via SUBCUTANEOUS

## 2016-06-24 MED ORDER — ENOXAPARIN SODIUM 40 MG/0.4ML ~~LOC~~ SOLN
40.0000 mg | SUBCUTANEOUS | Status: DC
  Administered 2016-06-24 – 2016-06-25 (×2): 40 mg via SUBCUTANEOUS
  Filled 2016-06-24: qty 0.4

## 2016-06-24 MED ORDER — HEPARIN BOLUS VIA INFUSION
2000.0000 [IU] | Freq: Once | INTRAVENOUS | Status: AC
  Administered 2016-06-24: 2000 [IU] via INTRAVENOUS
  Filled 2016-06-24: qty 2000

## 2016-06-24 NOTE — Progress Notes (Signed)
SBP 70s, pt AOx4 with no active complaints and other VSS. MD aware and advised new MAP goal >60. Also ordered VTE prophylaxis.  Will continue to monitor.

## 2016-06-24 NOTE — Progress Notes (Signed)
ANTICOAGULATION CONSULT NOTE  Pharmacy Consult for heparin Indication: chest pain/ACS  No Known Allergies  Patient Measurements: Height: 5\' 10"  (177.8 cm) Weight: 276 lb 10.8 oz (125.5 kg) IBW/kg (Calculated) : 73 Heparin Dosing Weight: 107 kg  Vital Signs: Temp: 98 F (36.7 C) (08/06 2357) Temp Source: Oral (08/06 2357) BP: 84/60 (08/07 0000) Pulse Rate: 76 (08/07 0000)  Labs:  Recent Labs  06/23/16 1657 06/23/16 2035 06/24/16 0127  HGB 12.5*  --  11.9*  HCT 38.0*  --  35.9*  PLT 228  --  228  HEPARINUNFRC  --   --  0.14*  CREATININE 1.06 1.26*  --   TROPONINI  --  7.57*  --     Estimated Creatinine Clearance: 82.9 mL/min (by C-G formula based on SCr of 1.26 mg/dL).  Assessment: 60 y.o. male with chest pain for heparin   Goal of Therapy:  Heparin level 0.3-0.7 units/ml Monitor platelets by anticoagulation protocol: Yes   Plan:  Heparin 2000 units IV bolus, then increase heparin 1600 units/hr Check heparin level in 6 hours.  Geannie RisenGreg Reyli Schroth, PharmD, BCPS 06/24/2016 1:46 AM

## 2016-06-24 NOTE — Progress Notes (Signed)
ANTICOAGULATION CONSULT NOTE  Pharmacy Consult for heparin Indication: chest pain/ACS  No Known Allergies  Patient Measurements: Height: 5\' 10"  (177.8 cm) Weight: 276 lb 14.4 oz (125.6 kg) IBW/kg (Calculated) : 73 Heparin Dosing Weight: 107 kg  Vital Signs: Temp: 97.7 F (36.5 C) (08/07 0808) Temp Source: Oral (08/07 0808) BP: 92/69 (08/07 0808) Pulse Rate: 77 (08/07 0808)  Labs:  Recent Labs  06/23/16 1657 06/23/16 2035 06/24/16 0127 06/24/16 0740  HGB 12.5*  --  11.9*  --   HCT 38.0*  --  35.9*  --   PLT 228  --  228  --   HEPARINUNFRC  --   --  0.14* 0.27*  CREATININE 1.06 1.26*  --  1.33*  TROPONINI  --  7.57* 7.22* 5.84*    Estimated Creatinine Clearance: 78.5 mL/min (by C-G formula based on SCr of 1.33 mg/dL).  Assessment: 60 y.o. male with chest pain now on heparin for r/o acs. Hgb 11.9, PLT 228. HL subtherapeutic this am. No issues with line or bleeding per nurse.  Goal of Therapy:  Heparin level 0.3-0.7 units/ml Monitor platelets by anticoagulation protocol: Yes   Plan:  Heparin 1500 units IV bolus Increase heparin to 1800 units/hr Check heparin level in 6 hours Daily HL and CBC  Sherron MondayAubrey N. Lazette Estala, PharmD Clinical Pharmacy Resident Pager: [phone] 06/24/16 10:40 AM

## 2016-06-24 NOTE — Progress Notes (Signed)
SUBJECTIVE:  CP significantly better now.  Was worse with deep breathing, coughing and lying flat.  He prefers to sit upright.  Has LE edema as well.  BPs have been on the low side.   OBJECTIVE:   Vitals:   Vitals:   06/24/16 0400 06/24/16 0800 06/24/16 0808 06/24/16 1213  BP: 93/71  92/69 98/72  Pulse: 79  77 80  Resp:  15 16 15   Temp:   97.7 F (36.5 C) 97.1 F (36.2 C)  TempSrc:   Oral Oral  SpO2: 91%  93% 97%  Weight:      Height:       I&O's:   Intake/Output Summary (Last 24 hours) at 06/24/16 1232 Last data filed at 06/24/16 1100  Gross per 24 hour  Intake           346.38 ml  Output              500 ml  Net          -153.62 ml   TELEMETRY: Reviewed telemetry pt in NSR:     PHYSICAL EXAM General: Well developed, well nourished, in no acute distress Head:   Normal cephalic and atramatic  Lungs:   Clear bilaterally to auscultation. Heart:  HRRR S1 S2  No JVD.  No rub Abdomen: abdomen soft and non-tender Msk:  Back normal,  Normal strength and tone for age. Extremities:  Bilateral leg edema.   Neuro: Alert and oriented. Psych:  Normal affect, responds appropriately Skin: No rash; shiny skin on legs   LABS: Basic Metabolic Panel:  Recent Labs  62/13/807/05/05 2035 06/24/16 0740  NA 133* 131*  K 4.0 3.9  CL 98* 97*  CO2 25 21*  GLUCOSE 176* 173*  BUN 15 21*  CREATININE 1.26* 1.33*  CALCIUM 8.7* 8.6*   Liver Function Tests:  Recent Labs  06/23/16 2035 06/24/16 0740  AST 44* 31  ALT 75* 62  ALKPHOS 127* 117  BILITOT 1.5* 1.4*  PROT 6.6 6.6  ALBUMIN 3.0* 2.9*   No results for input(s): LIPASE, AMYLASE in the last 72 hours. CBC:  Recent Labs  06/23/16 1657 06/24/16 0127  WBC 11.1* 12.6*  HGB 12.5* 11.9*  HCT 38.0* 35.9*  MCV 90.5 88.2  PLT 228 228   Cardiac Enzymes:  Recent Labs  06/23/16 2035 06/24/16 0127 06/24/16 0740  TROPONINI 7.57* 7.22* 5.84*   BNP: Invalid input(s): POCBNP D-Dimer: No results for input(s): DDIMER in  the last 72 hours. Hemoglobin A1C: No results for input(s): HGBA1C in the last 72 hours. Fasting Lipid Panel:  Recent Labs  06/24/16 0127  CHOL 90  HDL 35*  LDLCALC 42  TRIG 66  CHOLHDL 2.6   Thyroid Function Tests:  Recent Labs  06/23/16 2035  TSH 2.186   Anemia Panel: No results for input(s): VITAMINB12, FOLATE, FERRITIN, TIBC, IRON, RETICCTPCT in the last 72 hours. Coag Panel:   Lab Results  Component Value Date   INR 1.09 06/18/2016    RADIOLOGY: Dg Chest 2 View  Result Date: 06/23/2016 CLINICAL DATA:  Chest pressure EXAM: CHEST  2 VIEW COMPARISON:  06/14/2016 FINDINGS: The heart size and mediastinal contours are within normal limits. Both lungs are clear. The visualized skeletal structures are unremarkable. IMPRESSION: No active cardiopulmonary disease. Electronically Signed   By: Alcide CleverMark  Lukens M.D.   On: 06/23/2016 17:34   Dg Chest Portable 1 View  Result Date: 06/14/2016 CLINICAL DATA:  Chest pain beginning this morning.  Sweating. EXAM:  PORTABLE CHEST 1 VIEW COMPARISON:  None. FINDINGS: Lungs are clear. Lung volumes are somewhat low. Heart size is normal. No pneumothorax or pleural effusion. IMPRESSION: No acute disease. Electronically Signed   By: Drusilla Kanner M.D.   On: 06/14/2016 11:43     ASSESSMENT: Tyler Harper:    1) Troponins continue to decrease.  Stop heparin.  Treat with antiinflammatories.  Follow renal function.  No need for repeat cath at this time.  ST segments have come down as well.  Start acid reducer to prevent any GI bleeding issues.   2) Leg edema: start low dose Lasix.   3) Findings discussed with family extensively.    Corky Crafts, MD  06/24/2016  12:32 PM

## 2016-06-24 NOTE — Progress Notes (Signed)
Inpatient Diabetes Program Recommendations  AACE/ADA: New Consensus Statement on Inpatient Glycemic Control (2015)  Target Ranges:  Prepandial:   less than 140 mg/dL      Peak postprandial:   less than 180 mg/dL (1-2 hours)      Critically ill patients:  140 - 180 mg/dL  Results for Tyler Harper, [name] (MRN [id number]) as of 06/24/2016 15:28  Ref. Range 06/19/2016 07:34 06/19/2016 11:52 06/19/2016 16:17 06/19/2016 21:31 06/20/2016 07:36 06/20/2016 12:03 06/24/2016 12:11  Glucose-Capillary Latest Ref Range: 65 - 99 mg/dL 638184 (H) 756241 (H) 433211 (H) 177 (H) 170 (H) 249 (H) 199 (H)   Review of Glycemic Control  Diabetes history: DM 2 (Diagnosed 06/14/16) Outpatient Diabetes medications: Levemir 32 units + Metformin 500 mg bid Current orders for Inpatient glycemic control: Levemir 32 units hs + Metformin 500 mg bid  Inpatient Diabetes Program Recommendations:  Spoke with patient and reviewed basic nutrition information and will follow during hospitalization. Patient states he was discharged on Lantus insulin but insurance preferred Levemir and patient bought out of pocket the first month of Levemir. Gave patient a $0 copay card to use for Lantus if he needs for future prescriptions and will work if placed as secondary primary. Patient has been watching some patient education videos.  Thank you, Billy FischerJudy E. Jem Castro, RN, MSN, CDE Inpatient Glycemic Control Team Team Pager [phone] (8am-5pm) 06/24/2016 3:38 PM

## 2016-06-25 DIAGNOSIS — N179 Acute kidney failure, unspecified: Secondary | ICD-10-CM

## 2016-06-25 DIAGNOSIS — R600 Localized edema: Secondary | ICD-10-CM

## 2016-06-25 DIAGNOSIS — I309 Acute pericarditis, unspecified: Principal | ICD-10-CM

## 2016-06-25 LAB — BASIC METABOLIC PANEL
Anion gap: 11 (ref 5–15)
BUN: 35 mg/dL — ABNORMAL HIGH (ref 6–20)
CALCIUM: 8.6 mg/dL — AB (ref 8.9–10.3)
CO2: 23 mmol/L (ref 22–32)
CREATININE: 1.89 mg/dL — AB (ref 0.61–1.24)
Chloride: 98 mmol/L — ABNORMAL LOW (ref 101–111)
GFR calc non Af Amer: 37 mL/min — ABNORMAL LOW (ref >60)
GFR, EST AFRICAN AMERICAN: 43 mL/min — AB (ref >60)
Glucose, Bld: 118 mg/dL — ABNORMAL HIGH (ref 65–99)
Potassium: 3.5 mmol/L (ref 3.5–5.1)
SODIUM: 132 mmol/L — AB (ref 135–145)

## 2016-06-25 LAB — GLUCOSE, CAPILLARY
GLUCOSE-CAPILLARY: 104 mg/dL — AB (ref 65–99)
GLUCOSE-CAPILLARY: 120 mg/dL — AB (ref 65–99)
GLUCOSE-CAPILLARY: 153 mg/dL — AB (ref 65–99)
Glucose-Capillary: 125 mg/dL — ABNORMAL HIGH (ref 65–99)

## 2016-06-25 NOTE — Progress Notes (Addendum)
SUBJECTIVE:  CP gone now.  Was worse with deep breathing, coughing and lying flat.  He prefers to sit upright.  Has LE edema as well.  BPs have been on the low side. Slept upright.  Felt he was urinating a lot.  OBJECTIVE:   Vitals:   Vitals:   06/25/16 0012 06/25/16 0438 06/25/16 0700 06/25/16 0819  BP: (!) 86/61 116/78  110/62  Pulse: 70 75 75   Resp:   14   Temp: 97.9 F (36.6 C) 98 F (36.7 C)  97.4 F (36.3 C)  TempSrc: Oral Oral  Oral  SpO2: 96% 94% 95% 96%  Weight:      Height:       I&O's:    Intake/Output Summary (Last 24 hours) at 06/25/16 1206 Last data filed at 06/25/16 0900  Gross per 24 hour  Intake              400 ml  Output                1 ml  Net              399 ml   TELEMETRY: Reviewed telemetry pt in NSR:     PHYSICAL EXAM General: Well developed, well nourished, in no acute distress Head:   Normal cephalic and atramatic  Lungs:   Clear bilaterally to auscultation. Heart:  HRRR S1 S2  No JVD.  No rub Abdomen: abdomen soft and non-tender Msk:  Back normal,  Normal strength and tone for age. Extremities:  Bilateral leg edema.   Neuro: Alert and oriented. Psych:  Normal affect, responds appropriately Skin: No rash; shiny skin on legs   LABS: Basic Metabolic Panel:  Recent Labs  16/10/96 0740 06/25/16 0258  NA 131* 132*  K 3.9 3.5  CL 97* 98*  CO2 21* 23  GLUCOSE 173* 118*  BUN 21* 35*  CREATININE 1.33* 1.89*  CALCIUM 8.6* 8.6*   Liver Function Tests:  Recent Labs  06/23/16 2035 06/24/16 0740  AST 44* 31  ALT 75* 62  ALKPHOS 127* 117  BILITOT 1.5* 1.4*  PROT 6.6 6.6  ALBUMIN 3.0* 2.9*   No results for input(s): LIPASE, AMYLASE in the last 72 hours. CBC:  Recent Labs  06/23/16 1657 06/24/16 0127  WBC 11.1* 12.6*  HGB 12.5* 11.9*  HCT 38.0* 35.9*  MCV 90.5 88.2  PLT 228 228   Cardiac Enzymes:  Recent Labs  06/23/16 2035 06/24/16 0127 06/24/16 0740  TROPONINI 7.57* 7.22* 5.84*   BNP: Invalid input(s):  POCBNP D-Dimer: No results for input(s): DDIMER in the last 72 hours. Hemoglobin A1C: No results for input(s): HGBA1C in the last 72 hours. Fasting Lipid Panel:  Recent Labs  06/24/16 0127  CHOL 90  HDL 35*  LDLCALC 42  TRIG 66  CHOLHDL 2.6   Thyroid Function Tests:  Recent Labs  06/23/16 2035  TSH 2.186   Anemia Panel: No results for input(s): VITAMINB12, FOLATE, FERRITIN, TIBC, IRON, RETICCTPCT in the last 72 hours. Coag Panel:   Lab Results  Component Value Date   INR 1.09 06/18/2016    RADIOLOGY: Dg Chest 2 View  Result Date: 06/23/2016 CLINICAL DATA:  Chest pressure EXAM: CHEST  2 VIEW COMPARISON:  06/14/2016 FINDINGS: The heart size and mediastinal contours are within normal limits. Both lungs are clear. The visualized skeletal structures are unremarkable. IMPRESSION: No active cardiopulmonary disease. Electronically Signed   By: Alcide Clever M.D.   On: 06/23/2016 17:34  Dg Chest Portable 1 View  Result Date: 06/14/2016 CLINICAL DATA:  Chest pain beginning this morning.  Sweating. EXAM: PORTABLE CHEST 1 VIEW COMPARISON:  None. FINDINGS: Lungs are clear. Lung volumes are somewhat low. Heart size is normal. No pneumothorax or pleural effusion. IMPRESSION: No acute disease. Electronically Signed   By: Drusilla Kannerhomas  Dalessio M.D.   On: 06/14/2016 11:43     ASSESSMENT: Tyler Harper/PLAN:    1) Troponins continued to decrease.  Stopped heparin and no increase in pain.  Treatment with antiinflammatories seemed to help.  Likely pericarditis.  Stop ibuprofen now.  No need for repeat cath at this time.  ST segments have come down as well.     2) Leg edema/ acute systolic heart failure related to MI: continue low dose Lasix as renal function tolerates.   3) Findings discussed with family extensively.    4) AKI: stopped lisinopril, metformin, ibuprofen.  Recheck Cr in AM.  Decreased EF is reson for ACE-I.  Try to restart when kidney function improved.    Corky CraftsJayadeep S Andilynn Delavega, MD  06/25/2016   12:06 PM

## 2016-06-25 NOTE — Progress Notes (Signed)
Transferred to [id number] by wheelchair, stable. Report given to RN, belongings with pt.

## 2016-06-26 ENCOUNTER — Telehealth: Payer: Self-pay | Admitting: Interventional Cardiology

## 2016-06-26 ENCOUNTER — Other Ambulatory Visit: Payer: Self-pay | Admitting: Physician Assistant

## 2016-06-26 DIAGNOSIS — E785 Hyperlipidemia, unspecified: Secondary | ICD-10-CM

## 2016-06-26 DIAGNOSIS — E1165 Type 2 diabetes mellitus with hyperglycemia: Secondary | ICD-10-CM

## 2016-06-26 DIAGNOSIS — I309 Acute pericarditis, unspecified: Secondary | ICD-10-CM

## 2016-06-26 DIAGNOSIS — I2510 Atherosclerotic heart disease of native coronary artery without angina pectoris: Secondary | ICD-10-CM

## 2016-06-26 DIAGNOSIS — N289 Disorder of kidney and ureter, unspecified: Secondary | ICD-10-CM

## 2016-06-26 DIAGNOSIS — E1159 Type 2 diabetes mellitus with other circulatory complications: Secondary | ICD-10-CM

## 2016-06-26 DIAGNOSIS — Z794 Long term (current) use of insulin: Secondary | ICD-10-CM

## 2016-06-26 LAB — GLUCOSE, CAPILLARY
Glucose-Capillary: 101 mg/dL — ABNORMAL HIGH (ref 65–99)
Glucose-Capillary: 125 mg/dL — ABNORMAL HIGH (ref 65–99)

## 2016-06-26 LAB — BASIC METABOLIC PANEL
ANION GAP: 10 (ref 5–15)
BUN: 29 mg/dL — ABNORMAL HIGH (ref 6–20)
CALCIUM: 8.8 mg/dL — AB (ref 8.9–10.3)
CO2: 25 mmol/L (ref 22–32)
Chloride: 101 mmol/L (ref 101–111)
Creatinine, Ser: 1.37 mg/dL — ABNORMAL HIGH (ref 0.61–1.24)
GFR, EST NON AFRICAN AMERICAN: 55 mL/min — AB (ref >60)
Glucose, Bld: 109 mg/dL — ABNORMAL HIGH (ref 65–99)
POTASSIUM: 3.6 mmol/L (ref 3.5–5.1)
Sodium: 136 mmol/L (ref 135–145)

## 2016-06-26 NOTE — Discharge Summary (Signed)
Discharge Summary    Patient ID: Tyler Harper,  MRN: [id number], DOB/AGE: 1956/02/24 60 y.o.  Admit date: 06/23/2016 Discharge date: 06/26/2016  Primary Care Provider: Jolyne Loa Primary Cardiologist: Dr. Irish Lack  Discharge Diagnoses    Principal Problem:   Acute pericarditis Active Problems:   Diabetes mellitus type 2, uncontrolled ([place])   Hyperlipidemia LDL goal <70   Coronary artery disease involving native coronary artery of native heart with unstable angina pectoris (HCC)   Non-STEMI (non-ST elevated myocardial infarction) ([place])   Allergies No Known Allergies  Diagnostic Studies/Procedures    None _____________   History of Present Illness     60 yo morbidly obese male with PMH of remote tobacco abuse, h/o DVT and CAD s/p PCI to mid RCA with subsequently failed stent presented to [facility] on 06/23/2016 with complaint of chest pressure and SOB. He initially presented to [facility] on 06/14/2016 as inferior STEMI and was subsequently transferred to [facility]. He underwent cardiac catheterization on the same day which showed 100% proximal to mid RCA lesion treated with 4 x 38 mm Synergy DES. He was placed on aspirin and Brilinta along with low-dose beta blocker and high-dose statin. IV tirofiban was continued for 18 hours post intervention. His hemoglobin A1c was noted to be 10.6, therefore he was diagnosed with new diabetes and started on insulin. 2-D echo obtained showed EF 25-30% with basal and mid inferior akinesis and hypokinesis of inferolateral/inferoseptal and apical walls. He was fitted with LifeVest with plan to reassess ejection fraction in 2 month. Unfortunately on 06/17/2016, he had recurrent chest discomfort. Initially troponin did go down from 15 down to 14, however overnight it went up to 15 again. He was seen in the morning of 8/1, given his persistent chest discomfort, he underwent relook cardiac catheterization by Dr. Angelena Form which showed  subacute occlusion of mid RCA stent. A second drug-eluting stent was placed via overlapping fashion. Unfortunately there was no flow to the mid distal vessel likely due to massive amount of thrombus in the distal vascular bed. He was discharged on 06/20/2016.   He was doing well initially for 2 days after discharge, in the afternoon of 06/23/2016 around 4 PM, he started having recurrent chest pressure reminiscent of the previous angina. I talk to the patient's wife and I advised the patient to seek medical attention immediately in the ED. However by the time I call back, patient was almost ready driven by his wife to the ED. Initial CXR was negative for acute process. Trop 10.48. Hgn was 12.5. EKG showed persistent ST elevation in the inferior lead. Despite having significant lower extremity edema, he denies any orthopnea or paroxysmal nocturnal dyspnea.   Hospital Course     Patient was admitted to cardiology service, initial troponin was 10.48 which was trending down from the previous level. The previous cardiac cath film has been reviewed, despite stenting of the proximal RCA, majority of the RCA failed to reperfuse. He was seen on the following day at which time he complained of chest pain when laying down, with improvement when sitting up. Given onset of MI a week ago, there was some concern of pericarditis. He was treated with ibuprofen, unfortunately his renal function worsened with creatinine peak at 1.89. Serial troponin continued to trend down over time. The decision is not to pursue a relook cardiac catheterization as benefit is quite low at this point given significant infarction. Due to acute kidney injury, his metformin  and his lisinopril was also held. His renal function did improve on the following day with creatinine 1.37 in the morning of 06/26/2016.   He was seen in the morning of discharge, at which time he was chest pain-free for at least 36 hours. He is deemed stable for discharge from  cardiology perspective. I have arranged 7 day TCM followup with BMET check. We have held the lisinopril on discharge given borderline BP and recent AKI.   _____________  Discharge Vitals Blood pressure 100/69, pulse 63, temperature 98.2 F (36.8 C), temperature source Oral, resp. rate 18, height _0  (1.778 m), weight 271 lb 4.8 oz (123.1 kg), SpO2 99 %.  Filed Weights   06/24/16 0300 06/25/16 1216 06/26/16 0504  Weight: 276 lb 14.4 oz (125.6 kg) 281 lb 4.9 oz (127.6 kg) 271 lb 4.8 oz (123.1 kg)    Labs & Radiologic Studies     CBC  Recent Labs  06/23/16 1657 06/24/16 0127  WBC 11.1* 12.6*  HGB 12.5* 11.9*  HCT 38.0* 35.9*  MCV 90.5 88.2  PLT 228 161   Basic Metabolic Panel  Recent Labs  06/25/16 0258 06/26/16 0410  NA 132* 136  K 3.5 3.6  CL 98* 101  CO2 23 25  GLUCOSE 118* 109*  BUN 35* 29*  CREATININE 1.89* 1.37*  CALCIUM 8.6* 8.8*   Liver Function Tests  Recent Labs  06/23/16 2035 06/24/16 0740  AST 44* 31  ALT 75* 62  ALKPHOS 127* 117  BILITOT 1.5* 1.4*  PROT 6.6 6.6  ALBUMIN 3.0* 2.9*   Cardiac Enzymes  Recent Labs  06/23/16 2035 06/24/16 0127 06/24/16 0740  TROPONINI 7.57* 7.22* 5.84*    Fasting Lipid Panel  Recent Labs  06/24/16 0127  CHOL 90  HDL 35*  LDLCALC 42  TRIG 66  CHOLHDL 2.6   Thyroid Function Tests  Recent Labs  06/23/16 2035  TSH 2.186    Dg Chest 2 View  Result Date: 06/23/2016 CLINICAL DATA:  Chest pressure EXAM: CHEST  2 VIEW COMPARISON:  06/14/2016 FINDINGS: The heart size and mediastinal contours are within normal limits. Both lungs are clear. The visualized skeletal structures are unremarkable. IMPRESSION: No active cardiopulmonary disease. Electronically Signed   By: Inez Catalina M.D.   On: 06/23/2016 17:34   Dg Chest Portable 1 View  Result Date: 06/14/2016 CLINICAL DATA:  Chest pain beginning this morning.  Sweating. EXAM: PORTABLE CHEST 1 VIEW COMPARISON:  None. FINDINGS: Lungs are clear. Lung  volumes are somewhat low. Heart size is normal. No pneumothorax or pleural effusion. IMPRESSION: No acute disease. Electronically Signed   By: Inge Rise M.D.   On: 06/14/2016 11:43   Disposition   Pt is being discharged home today in good condition.  Follow-up Plans & Appointments    Follow-up Information    Lyda Jester, PA-C Follow up on 07/03/2016.   Specialties:  Cardiology, Radiology Why:  9:30AM. Cardiology followup. Obtain BMET on the same day.  Contact information: [address] [place] [address] [phone]        DAVIS, Molinda Bailiff, PA. Schedule an appointment as soon as possible for a visit today.   Specialty:  Physician Assistant Contact information: [place] [address] [phone]            Discharge Medications   Current Discharge Medication List    CONTINUE these medications which have NOT CHANGED   Details  aspirin 81 MG tablet Take 81 mg  by mouth every morning.    atorvastatin (LIPITOR) 80 MG tablet Take 1 tablet (80 mg total) by mouth daily at 6 PM. Qty: 30 tablet, Refills: 11    blood glucose meter kit and supplies KIT Dispense based on patient and insurance preference. Use up to four times daily as directed. (FOR ICD-9 250.00, 250.01). Qty: 1 each, Refills: 0    carvedilol (COREG) 3.125 MG tablet Take 1 tablet (3.125 mg total) by mouth 2 (two) times daily with a meal. Qty: 60 tablet, Refills: 11    furosemide (LASIX) 20 MG tablet Take 1 tablet (20 mg total) by mouth daily. Qty: 30 tablet, Refills: 11    metFORMIN (GLUCOPHAGE) 500 MG tablet Take 1 tablet (500 mg total) by mouth 2 (two) times daily with a meal. Qty: 60 tablet, Refills: 11    nitroGLYCERIN (NITROSTAT) 0.4 MG SL tablet Place 1 tablet (0.4 mg total) under the tongue every 5 (five) minutes x 3 doses as needed for chest pain. Qty: 25 tablet, Refills: 3    potassium chloride SA (K-DUR,KLOR-CON) 20 MEQ tablet Take 1 tablet (20 mEq  total) by mouth daily. Qty: 30 tablet, Refills: 11    spironolactone (ALDACTONE) 25 MG tablet Take 1 tablet (25 mg total) by mouth daily. Qty: 30 tablet, Refills: 11    ticagrelor (BRILINTA) 90 MG TABS tablet Take 1 tablet (90 mg total) by mouth 2 (two) times daily. Qty: 60 tablet, Refills: 10    Insulin Pen Needle 31G X 5 MM MISC 1 pen by Does not apply route once. Qty: 30 each, Refills: 3      STOP taking these medications     Insulin Detemir (LEVEMIR) 100 UNIT/ML Pen      lisinopril (PRINIVIL,ZESTRIL) 2.5 MG tablet          Outstanding Labs/Studies   BMET on followup  Duration of Discharge Encounter   Greater than 30 minutes including physician time.  Signed, Almyra Deforest PA-C 06/26/2016, 11:51 AM  I have examined the patient and reviewed assessment and plan and discussed with patient.  Agree with above as stated.  CP gone.  Swelling better in legs.  Bruising from right groin going down the leg.  BP on the low side.  COntinue aldactone and lasix 20 mg daily for now.  Hold ACE-I.  Restart when BP increases.  Plan discharge later today.  Larae Grooms

## 2016-06-26 NOTE — Progress Notes (Signed)
Patient Name: Tyler Harper Date of Encounter: 06/26/2016  Primary Cardiologist: Dr. [facility] Dace  Pt. Profile:  60 yo morbidly obese male with PMH of remote tobacco abuse, h/o DVT and CAD s/p PCI to mid RCA with subsequently failed stent presented to [facility] on 06/23/2016 with complaint of chest pressure and SOB presented with recurrent chest pain and SOB.    Principal Problem:   Acute pericarditis Active Problems:   Diabetes mellitus type 2, uncontrolled (HCC)   Hyperlipidemia LDL goal <70   Coronary artery disease involving native coronary artery of native heart with unstable angina pectoris (HCC)   Non-STEMI (non-ST elevated myocardial infarction) (HCC)    SUBJECTIVE  Denies any CP since Monday. Denies any SOB. Slept in the chair last night.    CURRENT MEDS . aspirin EC  81 mg Oral BH-q7a  . atorvastatin  80 mg Oral q1800  . carvedilol  3.125 mg Oral BID WC  . enoxaparin (LOVENOX) injection  40 mg Subcutaneous Q24H  . furosemide  20 mg Oral Daily  . insulin aspart  0-15 Units Subcutaneous TID WC  . insulin detemir  32 Units Subcutaneous Q2200  . potassium chloride SA  20 mEq Oral Daily  . spironolactone  25 mg Oral Daily  . ticagrelor  90 mg Oral BID    OBJECTIVE  Vitals:   06/25/16 1216 06/25/16 1222 06/25/16 2014 06/26/16 0504  BP:  110/82 98/63 94/61   Pulse:   82 63  Resp:   16 18  Temp:  97.5 F (36.4 C) 98.4 F (36.9 C) 98.2 F (36.8 C)  TempSrc:  Oral Oral Oral  SpO2:  96% 100% 99%  Weight: 281 lb 4.9 oz (127.6 kg)   271 lb 4.8 oz (123.1 kg)  Height:        Intake/Output Summary (Last 24 hours) at 06/26/16 0816 Last data filed at 06/26/16 1610  Gross per 24 hour  Intake              565 ml  Output              775 ml  Net             -210 ml   Filed Weights   06/24/16 0300 06/25/16 1216 06/26/16 0504  Weight: 276 lb 14.4 oz (125.6 kg) 281 lb 4.9 oz (127.6 kg) 271 lb 4.8 oz (123.1 kg)    PHYSICAL EXAM  General: Pleasant, NAD. Neuro: Alert and  oriented X 3. Moves all extremities spontaneously. Psych: Normal affect. HEENT:  Normal  Neck: Supple without bruits or JVD. Lungs:  Resp regular and unlabored, CTA. Heart: RRR no s3, s4, or murmurs. Abdomen: Soft, non-tender, non-distended, BS + x 4.  Extremities: No clubbing, cyanosis. DP/PT/Radials 2+ and equal bilaterally. 2+ pitting edema last night.   Accessory Clinical Findings  CBC  Recent Labs  06/23/16 1657 06/24/16 0127  WBC 11.1* 12.6*  HGB 12.5* 11.9*  HCT 38.0* 35.9*  MCV 90.5 88.2  PLT 228 228   Basic Metabolic Panel  Recent Labs  06/25/16 0258 06/26/16 0410  NA 132* 136  K 3.5 3.6  CL 98* 101  CO2 23 25  GLUCOSE 118* 109*  BUN 35* 29*  CREATININE 1.89* 1.37*  CALCIUM 8.6* 8.8*   Liver Function Tests  Recent Labs  06/23/16 2035 06/24/16 0740  AST 44* 31  ALT 75* 62  ALKPHOS 127* 117  BILITOT 1.5* 1.4*  PROT 6.6 6.6  ALBUMIN 3.0* 2.9*   Cardiac  Enzymes  Recent Labs  06/23/16 2035 06/24/16 0127 06/24/16 0740  TROPONINI 7.57* 7.22* 5.84*   Fasting Lipid Panel  Recent Labs  06/24/16 0127  CHOL 90  HDL 35*  LDLCALC 42  TRIG 66  CHOLHDL 2.6   Thyroid Function Tests  Recent Labs  06/23/16 2035  TSH 2.186    TELE NSR without significant ventricular ectopy, frequent PACs    ECG  No new EKG  Echocardiogram 06/16/2016  LV EF: 25% -   30%  ------------------------------------------------------------------- Indications:      MI - follow-up 410.92.  ------------------------------------------------------------------- History:   PMH:  STEMI.  Dyspnea.  ------------------------------------------------------------------- Study Conclusions  - Left ventricle: The cavity size was normal. Septal wall thickness   was increased in a pattern of moderate LVH with mild hypertrophy   of the posterior wall. Systolic function was severely reduced.   The estimated ejection fraction was in the range of 25% to 30%.   Hypokinesis  of the inferolateral and inferoseptal myocardium.   Akinesis of the basal to mid inferior wall and hypokinesis of the   apical inferior wall. Doppler parameters are consistent with   abnormal left ventricular relaxation (grade 1 diastolic   dysfunction). - Aortic valve: Transvalvular velocity was within the normal range.   There was no stenosis. There was no regurgitation. - Mitral valve: There was trivial regurgitation. - Right ventricle: The cavity size was mildly dilated. Wall   thickness was normal. Systolic function was mildly to moderately   reduced. - Right atrium: The atrium was moderately dilated. - Atrial septum: No defect or patent foramen ovale was identified   by color flow Doppler. - Tricuspid valve: There was trivial regurgitation. - Pulmonary arteries: Systolic pressure was within the normal   range. PA peak pressure: 20 mm Hg (S).    Radiology/Studies  Dg Chest 2 View  Result Date: 06/23/2016 CLINICAL DATA:  Chest pressure EXAM: CHEST  2 VIEW COMPARISON:  06/14/2016 FINDINGS: The heart size and mediastinal contours are within normal limits. Both lungs are clear. The visualized skeletal structures are unremarkable. IMPRESSION: No active cardiopulmonary disease. Electronically Signed   By: Alcide Clever M.D.   On: 06/23/2016 17:34   Dg Chest Portable 1 View  Result Date: 06/14/2016 CLINICAL DATA:  Chest pain beginning this morning.  Sweating. EXAM: PORTABLE CHEST 1 VIEW COMPARISON:  None. FINDINGS: Lungs are clear. Lung volumes are somewhat low. Heart size is normal. No pneumothorax or pleural effusion. IMPRESSION: No acute disease. Electronically Signed   By: Drusilla Kanner M.D.   On: 06/14/2016 11:43   ASSESSMENT AND PLAN  1. Likely acute pericarditis  - initially treated with NSAID, but he had AKI, therefore metformin, ibuprofen and lisinopril all stopped. May consider add low dose colchicine if chest does recur. He has not had chest pain since Monday, likely  discharge today. As for lisinopril, his BP is borderline, consider add lisinopril on followup once BP improve.  2. CAD with elevated trop: s/p stent to RCA, however subsequently failed, underwent DES to prox RCA again on 8/1, however due to heavy clot burden, RCA reperfusion failed to achieve.  - had ischemic cardiomyopathy with EF 25-30% on echo 06/17/2015, fitted with lifevest.  3. DM II: newly diagnosed during last admission. Hgb A1C 10.6 on 7/28  4. LE edema/Acute systolic heart failure: started on low dose lasix  5. AKI: Cr 1.89 --> 1.37, improving.   Ramond Dial PA-C Pager: [phone]   I have examined the patient  and reviewed assessment and plan and discussed with patient.  Agree with above as stated.  CP gone.  Swelling better in legs.  Bruising from right groin going down the leg.  BP on the low side.  COntinue aldactone and lasix 20 mg daily for now.  Hold ACE-I.  Restart when BP increases.  Plan discharge later today.  Lance MussJayadeep Wyoma Genson

## 2016-06-26 NOTE — Telephone Encounter (Signed)
New Message  TOC apt with Tyler Harper on 8.16, @ 9:45am, App from hospital didn't provide a his name.

## 2016-06-27 NOTE — Telephone Encounter (Signed)
PT CALLED  BACK FROM MESSAGE  THAT WAS   LEFT EARLIER. PT  IS   DOING  FINE  NO QUESTIONS  OR  CONCERNS AND  IS  AWARE OF  UPCOMING APPT .Tyler Harper/CY

## 2016-06-30 ENCOUNTER — Telehealth: Payer: Self-pay | Admitting: Nurse Practitioner

## 2016-06-30 NOTE — Telephone Encounter (Signed)
   Pts wife called to report that pt awoke this AM feeling SOB and his lifevest was beeping.  He was not feeling presyncopal and so he disabled the lifevest and then took it off.  He remains dyspneic after 2 sl ntg.  No c/p.  Pts wife has called EMS.  I rec that she provide him with ASA, brilinta, and coreg now.  [facility]   Caller verbalized understanding and was grateful for the call back.  Nicolasa Duckinghristopher Meshulem Onorato, NP 06/30/2016, 7:59 AM

## 2016-07-03 ENCOUNTER — Ambulatory Visit: Payer: 59 | Admitting: Cardiology

## 2018-06-29 IMAGING — CR DG CHEST 2V
2 series · 2 of 2 positions shown · non-contrast
Comparison: 06/14/2016

CLINICAL DATA: Chest pressure

EXAM:
CHEST  2 VIEW

[chest lat]
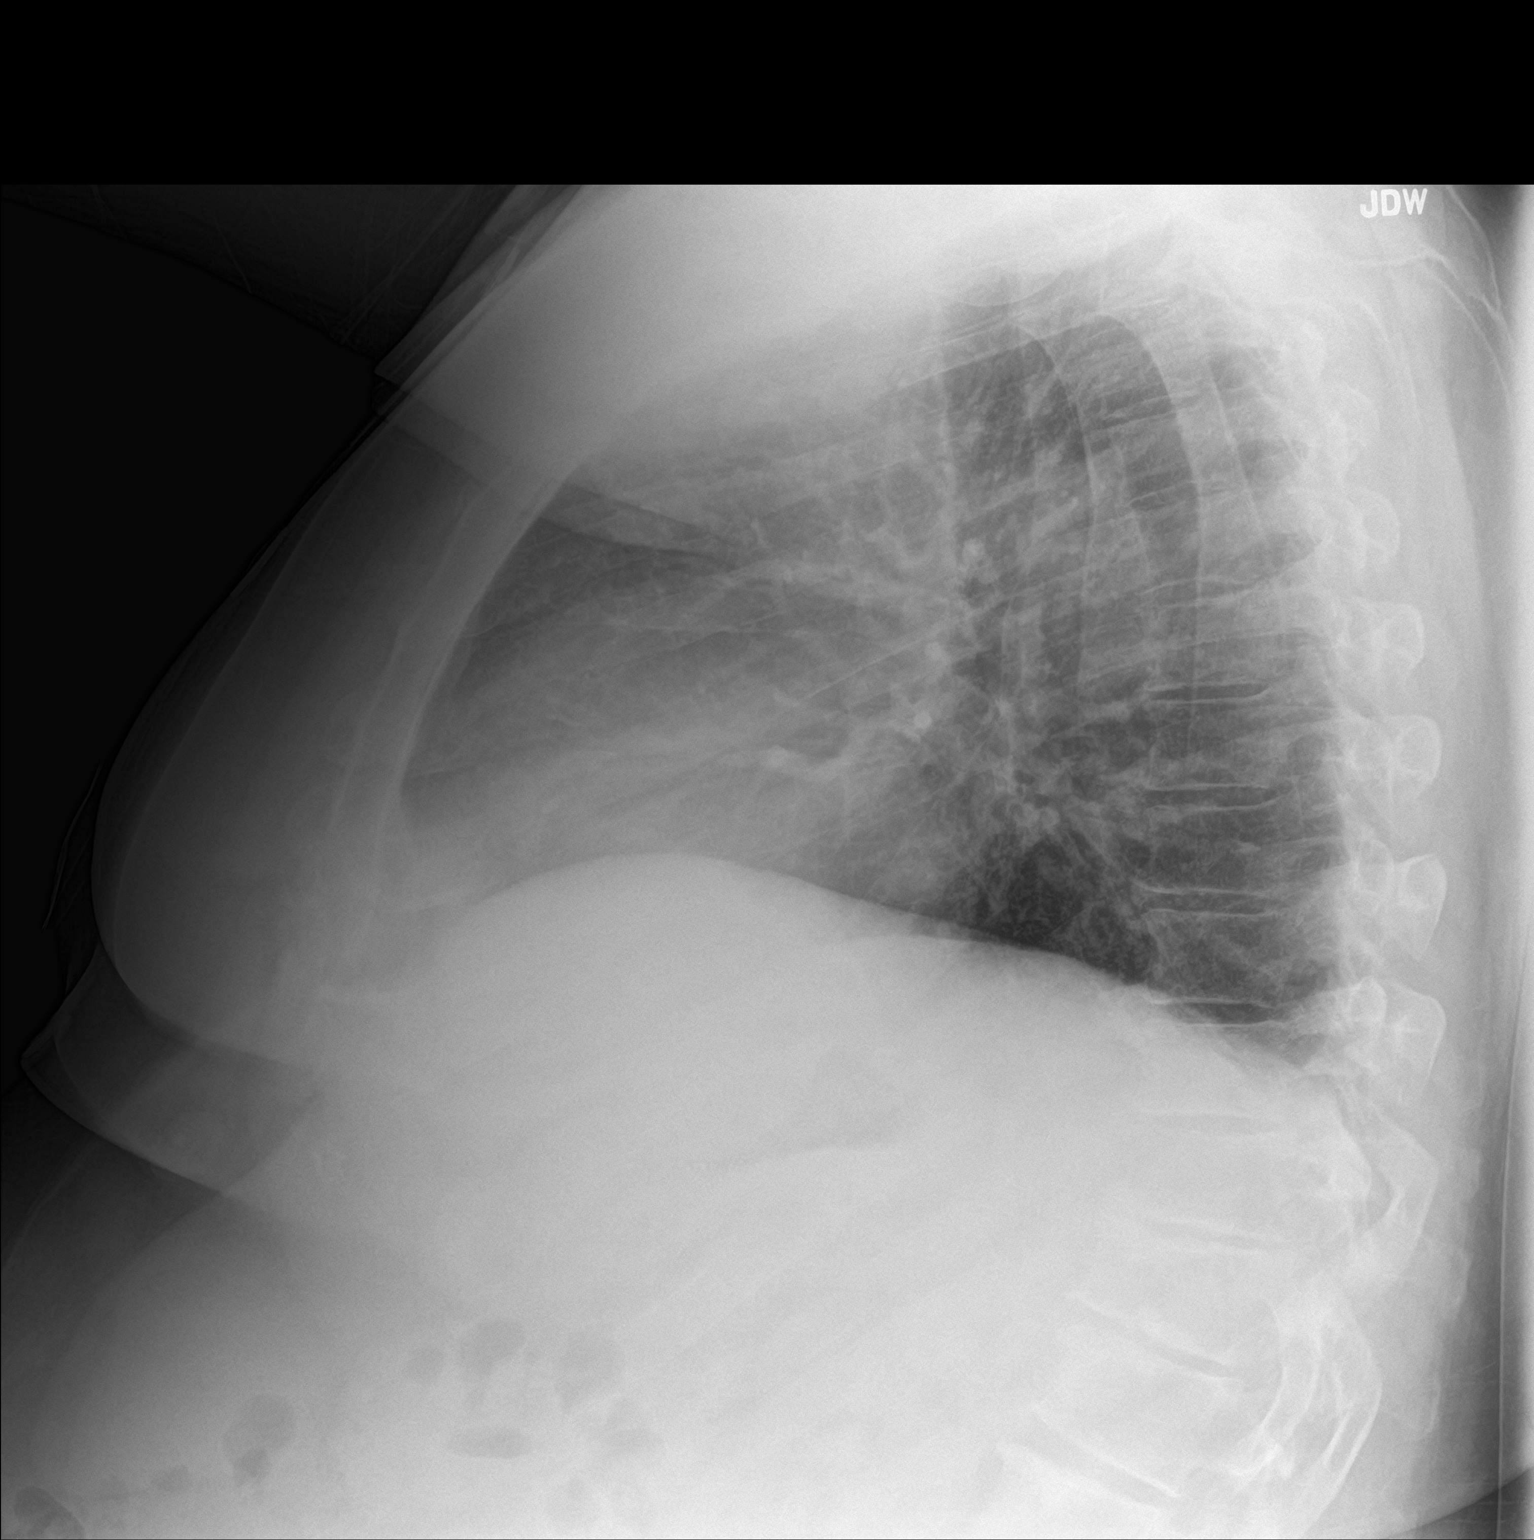

[chest ap]
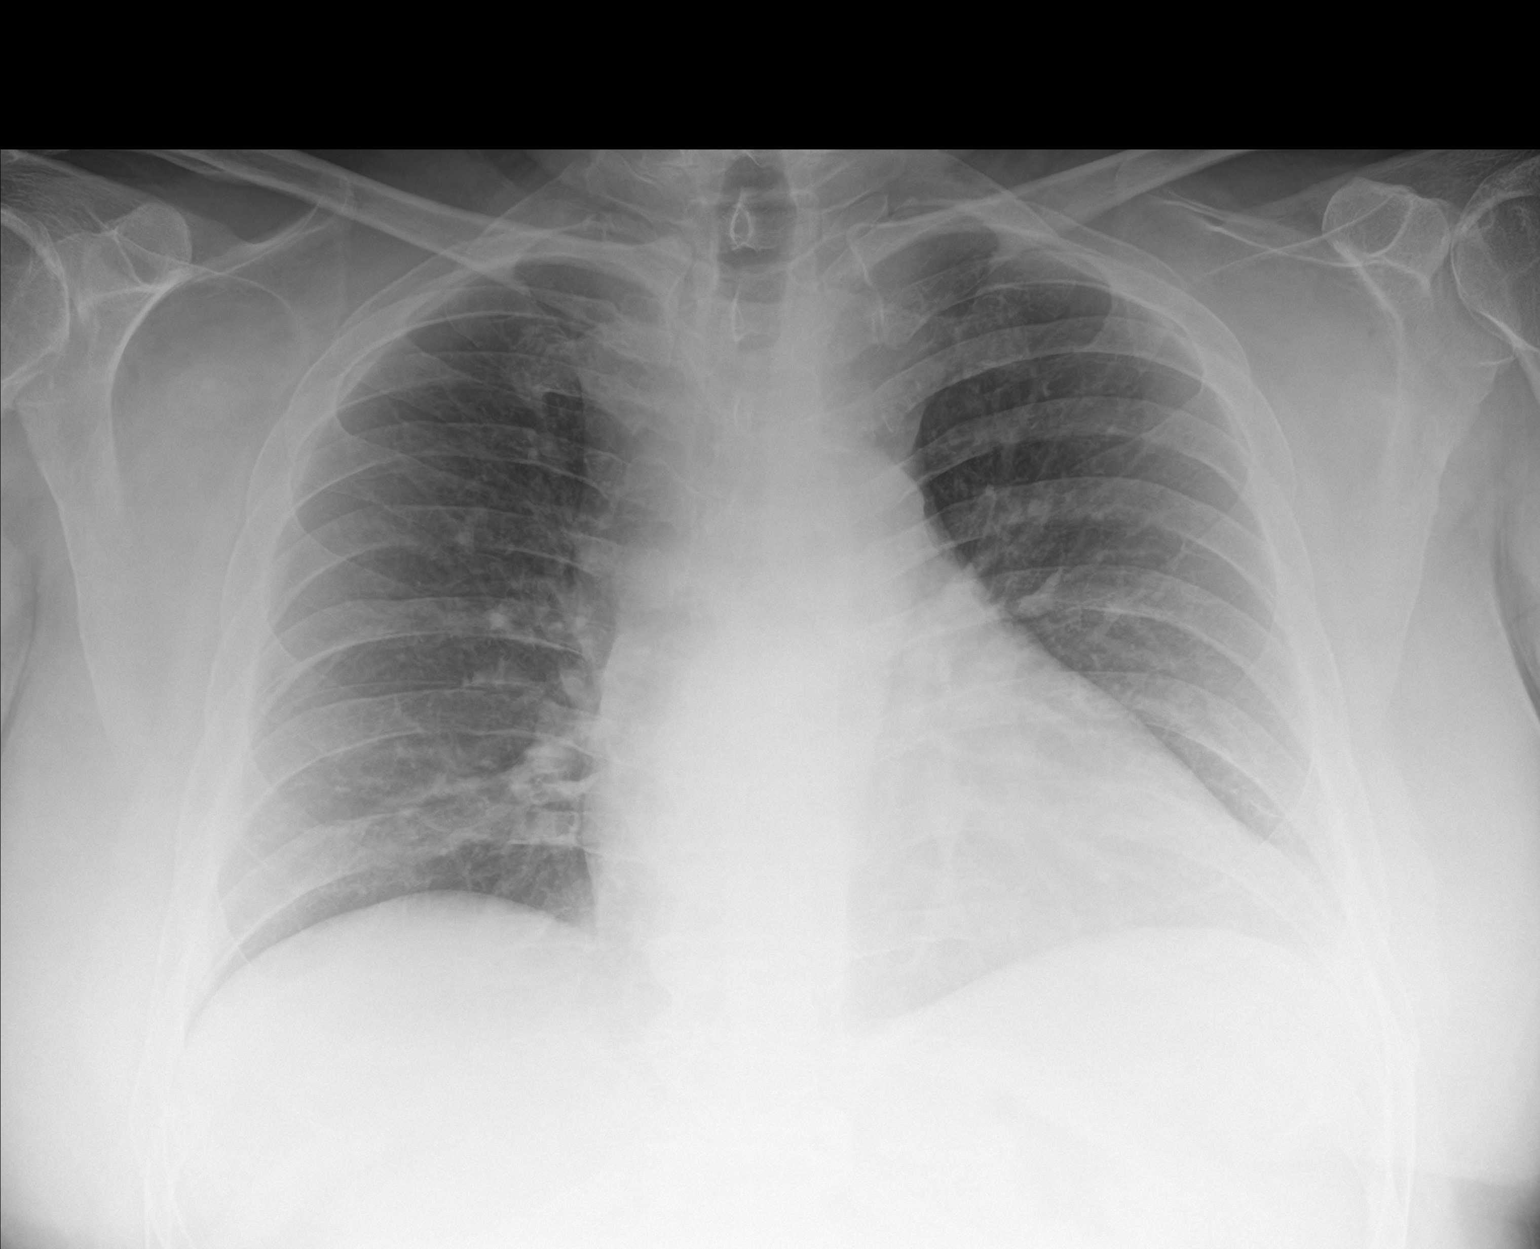

[2 of 2 positions shown; findings below may reference images not displayed]

FINDINGS: The heart size and mediastinal contours are within normal limits.
Both lungs are clear. The visualized skeletal structures are
unremarkable.
IMPRESSION: No active cardiopulmonary disease.
